# Patient Record
Sex: Male | Born: 1980 | Race: White | Hispanic: No | Marital: Married | Smoking: Never smoker
Health system: Southern US, Community
[De-identification: ages and names within clinical notes are randomized; demographics above are authoritative.]

## PROBLEM LIST (undated history)

## (undated) DIAGNOSIS — Z8619 Personal history of other infectious and parasitic diseases: Secondary | ICD-10-CM

## (undated) DIAGNOSIS — K760 Fatty (change of) liver, not elsewhere classified: Secondary | ICD-10-CM

## (undated) DIAGNOSIS — I10 Essential (primary) hypertension: Secondary | ICD-10-CM

## (undated) DIAGNOSIS — B019 Varicella without complication: Secondary | ICD-10-CM

## (undated) DIAGNOSIS — R12 Heartburn: Secondary | ICD-10-CM

## (undated) DIAGNOSIS — E1165 Type 2 diabetes mellitus with hyperglycemia: Secondary | ICD-10-CM

## (undated) HISTORY — DX: Fatty (change of) liver, not elsewhere classified: K76.0

## (undated) HISTORY — DX: Personal history of other infectious and parasitic diseases: Z86.19

## (undated) HISTORY — DX: Heartburn: R12

## (undated) HISTORY — PX: KNEE SURGERY: SHX244

## (undated) HISTORY — DX: Varicella without complication: B01.9

## (undated) HISTORY — DX: Type 2 diabetes mellitus with hyperglycemia: E11.65

---

## 2007-10-14 ENCOUNTER — Emergency Department (HOSPITAL_COMMUNITY): Admission: EM | Admit: 2007-10-14 | Discharge: 2007-10-14 | Payer: Self-pay | Admitting: Emergency Medicine
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
## 2010-11-26 ENCOUNTER — Ambulatory Visit (INDEPENDENT_AMBULATORY_CARE_PROVIDER_SITE_OTHER): Payer: Managed Care, Other (non HMO) | Admitting: Family

## 2010-11-26 ENCOUNTER — Encounter: Payer: Self-pay | Admitting: Family

## 2010-11-26 VITALS — BP 146/86 | HR 72 | Temp 98.1°F | Resp 16 | Ht 70.0 in | Wt 198.1 lb

## 2010-11-26 DIAGNOSIS — Z0000 Encounter for general adult medical examination without abnormal findings: Secondary | ICD-10-CM | POA: Insufficient documentation

## 2010-11-26 DIAGNOSIS — I10 Essential (primary) hypertension: Secondary | ICD-10-CM | POA: Insufficient documentation

## 2010-11-26 DIAGNOSIS — K219 Gastro-esophageal reflux disease without esophagitis: Secondary | ICD-10-CM

## 2010-11-26 DIAGNOSIS — R030 Elevated blood-pressure reading, without diagnosis of hypertension: Secondary | ICD-10-CM

## 2010-11-26 MED ORDER — OMEPRAZOLE 40 MG PO CPDR: 40.0000 mg | DELAYED_RELEASE_CAPSULE | Freq: Every day | ORAL | Status: DC

## 2010-11-26 NOTE — Progress Notes (Signed)
Subjective:    Patient ID: Mario Ibarra, male    DOB: 02/03/1981, 30 y.o.   MRN: [id number]  HPI  Breakfast- double cheesburger/tea, no fries.  Breakfast- peanuts/coke, Dinner- last night, subway terriyaki chicken.  Last tetanus shot- 1 year ago.    Notes that "everything I eat gives it to me."  Feels like it "burns going down."  GF has had a lot of issues with reflux.     Review of Systems  Constitutional: Negative for fever and unexpected weight change.  HENT: Negative for hearing loss, ear pain and neck pain.   Respiratory: Negative for apnea.   Cardiovascular: Positive for chest pain.  Gastrointestinal: Negative for nausea, diarrhea, constipation, blood in stool and abdominal distention.  Genitourinary: Negative for dysuria and frequency.  Musculoskeletal: Negative for back pain, joint swelling and arthralgias.  Skin: Negative for rash.  Neurological: Negative for dizziness and numbness.  Hematological: Negative for adenopathy.  Psychiatric/Behavioral:       Denies hx of depression or anxiety.       Objective:   Physical Exam  Constitutional: He is oriented to person, place, and time. He appears well-developed and well-nourished. No distress.  HENT:  Head: Atraumatic.  Eyes: Conjunctivae are normal. Pupils are equal, round, and reactive to light.  Neck: Normal range of motion. Neck supple. No thyromegaly present.  Cardiovascular: Normal rate and regular rhythm.   Pulmonary/Chest: Effort normal and breath sounds normal. No respiratory distress. He has no wheezes. He has no rales.  Abdominal: Soft. Bowel sounds are normal. He exhibits no distension. There is no tenderness. There is no guarding.  Musculoskeletal: Normal range of motion.  Neurological: He is alert and oriented to person, place, and time. He has normal reflexes.  Skin: Skin is warm and dry.  Psychiatric: He has a normal mood and affect. His behavior is normal.          Assessment & Plan:

## 2010-11-26 NOTE — Assessment & Plan Note (Signed)
Patient was counseled on low sodium diet.  Will repeat at his f/u visit in 1 month.

## 2010-11-26 NOTE — Assessment & Plan Note (Signed)
30 year old male presents today for CPX.  Patient was counseled on diet, exercise weight loss.  Advised to cut down on sugared beverages.  Tetanus up to date.  Pt will return fasting next week for labs.

## 2010-11-26 NOTE — Assessment & Plan Note (Signed)
Plan to have patient start PPI.   F/u in 1 month.

## 2010-11-26 NOTE — Patient Instructions (Signed)
2 Gram Low Sodium Diet  A 2 gram sodium diet restricts the amount of sodium in the diet to no more than 2 grams (g) or 2000 milligrams (mg) daily. Limiting the amount of sodium is often used to help lower blood pressure. It is important if you have heart, liver, or kidney problems. Many foods contain sodium for flavor and sometimes as a preservative. When the amount of sodium in a diet needs to be low, it is important to know what to look for when choosing foods and drinks. The following includes some information and guidelines to help make it easier for you to adapt to a low sodium diet.  QUICK TIPS   Do not add salt to food.    Avoid convenience items and fast food.    Choose unsalted snack foods.    Buy lower sodium products, often labeled as "lower sodium" or "no salt added."    Check food labels to learn how much sodium is in 1 serving.    When eating at a restaurant, ask that your food be prepared with less salt or none, if possible.   READING FOOD LABELS FOR SODIUM INFORMATION  The nutrition facts label is a good place to find how much sodium is in foods. Look for products with no more than 500 to 600 mg of sodium per meal and no more than 150 mg per serving.  Remember that 2 g = 2000 mg.  The food label may also list foods as:   Sodium-free: Less than 5 mg in a serving.    Very low sodium: 35 mg or less in a serving.    Low-sodium: 140 mg or less in a serving.    Light in sodium: 50% less sodium in a serving. For example, if a food that usually has 300 mg of sodium is changed to become light in sodium, it will have 150 mg of sodium.    Reduced sodium: 25% less sodium in a serving. For example, if a food that usually has 400 mg of sodium is changed to reduced sodium, it will have 300 mg of sodium.   CHOOSING FOODS  AVOID  CHOOSE    Grains:  Salted crackers and snack items.  Some cereals, including instant hot cereals.  Bread stuffing and biscuit mixes.  Seasoned rice or pasta mixes.  Grains:   Unsalted snack items.  Low-sodium cereals, oats, puffed wheat and rice, shredded wheat.  English muffins and bread.  Pasta.    Meats:  Salted, canned, smoked, spiced, or pickled meats, including fish and poultry.  Bacon, ham, sausage, cold cuts, hot dogs, anchovies.  Meats:  Low-sodium canned tuna and salmon.  Fresh or frozen meat, poultry, and fish.    Dairy:  Processed cheese and spreads.  Cottage cheese.  Buttermilk and condensed milk.  Regular cheese.  Dairy:  Milk.  Low-sodium cottage cheese.  Yogurt.  Sour cream.  Low-sodium cheese.    Fruits and Vegetables:  Regular canned vegetables.  Regular canned tomato sauce and paste.  Frozen vegetables in sauces.  Olives.  Pickles.  Relishes.  Sauerkraut.  Fruits and Vegetables:  Low-sodium canned vegetables.  Low-sodium tomato sauce and paste.  Fresh and frozen vegetables.  Fresh and frozen fruit.    Condiments:  Canned and packaged gravies.  Worcestershire sauce.  Tartar sauce.  Barbecue sauce.  Soy sauce.  Steak sauce.  Ketchup.  Onion, garlic, and table salt.  Meat flavorings and tenderizers.  Condiments:  Fresh and dried herbs   and spices.  Low-sodium varieties of mustard and ketchup.  Lemon juice.  Tabasco sauce.  Horseradish.    SAMPLE 2 GRAM SODIUM MEAL PLAN  Meal  Foods  Sodium (mg)    Breakfast  1 cup low-fat milk  2 slices whole-wheat toast  1 tablespoon heart-healthy margarine  1 hard-boiled egg  1 small orange  143  270  153  139  0    Lunch  1 cup raw carrots   cup hummus  1 cup low-fat milk   cup red grapes  1 whole-wheat pita bread  76  298  143  2  356    Dinner  1 cup whole-wheat pasta  1 cup low-sodium tomato sauce  3 ounces lean ground beef  1 small side salad (1 cup raw spinach leaves,  cup cucumber,  cup yellow bell pepper) with 1 teaspoon olive oil and 1 teaspoon red wine vinegar  2  73  57  25    Snack  1 container low-fat vanilla yogurt  3 graham cracker squares  107  127    Nutrient Analysis  Calories: 2033  Protein: 77 g   Carbohydrate: 282 g  Fat: 72 g  Sodium: 1971 mg  Document Released: 08/15/2005 Document Re-Released: 02/02/2010  [employer] Patient Information 2011 [employer].

## 2010-11-30 LAB — CBC WITH DIFFERENTIAL/PLATELET
Eosinophils Absolute: 0.2 10*3/uL (ref 0.0–0.7)
Eosinophils Relative: 2 % (ref 0–5)
Hemoglobin: 14.9 g/dL (ref 13.0–17.0)
Lymphs Abs: 2.4 10*3/uL (ref 0.7–4.0)
MCH: 28.9 pg (ref 26.0–34.0)
MCV: 87.8 fL (ref 78.0–100.0)
Monocytes Relative: 6 % (ref 3–12)
Platelets: 259 10*3/uL (ref 150–400)
RBC: 5.15 MIL/uL (ref 4.22–5.81)

## 2010-11-30 LAB — BASIC METABOLIC PANEL
CO2: 26 mEq/L (ref 19–32)
Calcium: 9.5 mg/dL (ref 8.4–10.5)
Creat: 1.07 mg/dL (ref 0.40–1.50)
Glucose, Bld: 100 mg/dL — ABNORMAL HIGH (ref 70–99)

## 2010-11-30 LAB — HEPATIC FUNCTION PANEL
Albumin: 4.6 g/dL (ref 3.5–5.2)
Total Protein: 7.2 g/dL (ref 6.0–8.3)

## 2010-11-30 LAB — TSH: TSH: 3.773 u[IU]/mL (ref 0.350–4.500)

## 2010-12-05 ENCOUNTER — Encounter: Payer: Self-pay | Admitting: Family

## 2010-12-24 ENCOUNTER — Encounter: Payer: Self-pay | Admitting: Family

## 2010-12-24 ENCOUNTER — Ambulatory Visit (INDEPENDENT_AMBULATORY_CARE_PROVIDER_SITE_OTHER): Payer: Managed Care, Other (non HMO) | Admitting: Family

## 2010-12-24 ENCOUNTER — Telehealth: Payer: Self-pay | Admitting: *Deleted

## 2010-12-24 DIAGNOSIS — R030 Elevated blood-pressure reading, without diagnosis of hypertension: Secondary | ICD-10-CM

## 2010-12-24 DIAGNOSIS — R7989 Other specified abnormal findings of blood chemistry: Secondary | ICD-10-CM | POA: Insufficient documentation

## 2010-12-24 DIAGNOSIS — K219 Gastro-esophageal reflux disease without esophagitis: Secondary | ICD-10-CM

## 2010-12-24 DIAGNOSIS — R7309 Other abnormal glucose: Secondary | ICD-10-CM

## 2010-12-24 DIAGNOSIS — Z0000 Encounter for general adult medical examination without abnormal findings: Secondary | ICD-10-CM

## 2010-12-24 NOTE — Telephone Encounter (Signed)
Message copied by Mervin Kung on Fri Dec 24, 2010  4:54 PM ------      Message from: O'SULLIVAN, MELISSA      Created: Fri Dec 24, 2010  3:12 PM       Pls send A1C (hyperglycemia) LFT (elevated LFTs) Lipid (V70 to lab)

## 2010-12-24 NOTE — Assessment & Plan Note (Signed)
Pt will return fasting to lab next week to have cholesterol and LFT's drawn.

## 2010-12-24 NOTE — Assessment & Plan Note (Signed)
Clinically improved.  Continue PPI.

## 2010-12-24 NOTE — Telephone Encounter (Signed)
Lab order entered and forwarded to the lab.

## 2010-12-24 NOTE — Assessment & Plan Note (Addendum)
BP Readings from Last 3 Encounters:  12/24/10 130/90  11/26/10 146/86   BP is better today.  Will continue to monitor.  I commended him on his dietary changes.

## 2010-12-24 NOTE — Patient Instructions (Signed)
Please return fasting to the lab in the next 1 week to complete fasting lab work.

## 2010-12-24 NOTE — Progress Notes (Signed)
Subjective:    Patient ID: Mario Ibarra, male    DOB: 1981/03/23, 30 y.o.   MRN: [id number]  HPI  Mario Ibarra is a 30 year old male who presents today for follow up.   GERD- reports that he has been taking omeprazole and has not had any further gerd symptoms. Finds that if he misses a dose or two he starts to feel symptoms.   Elevated blood pressure- he reports that he has made some positive dietary changes- which includes cutting back from 10 sodas a day to 3, and trying to ease off of the sweet tea.     Elevated LFT's- he was noted to have a mild elevation of LFT's last visit.      Review of Systems See HPI  Past Medical History  Diagnosis Date  . History of chicken pox   . Heartburn   . Chicken pox     History   Social History  . Marital Status: Married    Spouse Name: N/A    Number of Children: 1  . Years of Education: N/A   Occupational History  . 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 Smoking status: Never Smoker   . Smokeless tobacco: Never Used  . Alcohol Use: 0.6 oz/week    1 Cans of beer per week  . Drug Use: No  . Sexually Active: Not on file   Other Topics Concern  . Not on file   Social History Narrative   Regular exercise: noCaffeine: "all the time"     Past Surgical History  Procedure Date  . Knee surgery age 57    bone spur? left knee    Family History  Problem Relation Age of Onset  . Diabetes Maternal Grandmother   . Heart disease Maternal Grandmother     CABG x 4  . Diabetes Paternal Grandfather   . Kidney disease Paternal Grandfather     kidney failure due to diabetes?  . Sleep apnea Brother     No Known Allergies  Current Outpatient Prescriptions on File Prior to Visit  Medication Sig Dispense Refill  . omeprazole (PRILOSEC) 40 MG capsule Take 1 capsule (40 mg total) by mouth daily.  30 capsule  3    BP 130/90  Pulse 72  Temp(Src) 98.2 F (36.8 C) (Oral)  Resp 16  Wt  199 lb (90.266 kg)       Objective:   Physical Exam  Constitutional: He appears well-developed and well-nourished.  HENT:  Head: Atraumatic.  Cardiovascular: Normal rate and regular rhythm.   Pulmonary/Chest: Effort normal and breath sounds normal.  Abdominal: Soft. Bowel sounds are normal.          Assessment & Plan:

## 2011-01-06 LAB — HEMOGLOBIN A1C
Hgb A1c MFr Bld: 5.7 % — ABNORMAL HIGH (ref <5.7)
Mean Plasma Glucose: 117 mg/dL — ABNORMAL HIGH (ref <117)

## 2011-01-06 LAB — HEPATIC FUNCTION PANEL
Albumin: 4.5 g/dL (ref 3.5–5.2)
Total Protein: 7.4 g/dL (ref 6.0–8.3)

## 2011-01-07 LAB — LIPID PANEL
HDL: 35 mg/dL — ABNORMAL LOW (ref >39)
Triglycerides: 217 mg/dL — ABNORMAL HIGH (ref <150)

## 2011-01-12 ENCOUNTER — Telehealth: Payer: Self-pay | Admitting: Family

## 2011-01-12 NOTE — Telephone Encounter (Signed)
Liver tests are now normal.  Diabetes testing normal.  Total chol 203 (goal <200), triglycerides slightly elevated.  He should keep up the good work with diet, exercise, weight loss and avoid concentrated sweets.  Follow up in 6 months for BP check and repeat FLP.

## 2011-01-12 NOTE — Telephone Encounter (Signed)
Please advise 

## 2011-01-12 NOTE — Telephone Encounter (Signed)
Pt would like lab results.  

## 2011-01-12 NOTE — Telephone Encounter (Signed)
Pt notified and declines to schedule appt at this time. States that he will be traveling in a few months and doesn't know the exact dates yet. He will call us back to arrange f/u.

## 2011-06-28 ENCOUNTER — Other Ambulatory Visit: Payer: Self-pay | Admitting: Family

## 2011-06-28 NOTE — Telephone Encounter (Signed)
Pt left voice message requesting [facility]. States he has just moved to [place]. Advised pt I would send 30 day supply to pharmacy but he is due for a follow up in November. Pt states he will have to call us back to arrange appt as he is trying to close the paperwork on his new house this month.

## 2011-07-05 MED ORDER — OMEPRAZOLE 40 MG PO CPDR: 40.0000 mg | DELAYED_RELEASE_CAPSULE | Freq: Every day | ORAL | Status: DC

## 2011-07-05 NOTE — Telephone Encounter (Signed)
Rx cancelled at [facility]  

## 2011-07-05 NOTE — Telephone Encounter (Signed)
Addended by: Mervin Kung A on: 07/05/2011 09:06 AM   Modules accepted: Orders

## 2011-07-05 NOTE — Telephone Encounter (Signed)
[facility] call [facility] that [facility]. Advised pt refill was sent to [facility] in error but would be corrected now. [facility] [place].

## 2011-08-29 ENCOUNTER — Ambulatory Visit (INDEPENDENT_AMBULATORY_CARE_PROVIDER_SITE_OTHER): Payer: Managed Care, Other (non HMO) | Admitting: Family

## 2011-08-29 ENCOUNTER — Encounter: Payer: Self-pay | Admitting: Family

## 2011-08-29 VITALS — BP 146/80 | HR 80 | Temp 98.0°F | Resp 18 | Ht 70.0 in | Wt 205.0 lb

## 2011-08-29 DIAGNOSIS — K219 Gastro-esophageal reflux disease without esophagitis: Secondary | ICD-10-CM

## 2011-08-29 DIAGNOSIS — I10 Essential (primary) hypertension: Secondary | ICD-10-CM

## 2011-08-29 DIAGNOSIS — R0789 Other chest pain: Secondary | ICD-10-CM

## 2011-08-29 MED ORDER — AMLODIPINE BESYLATE 5 MG PO TABS: 5.0000 mg | ORAL_TABLET | Freq: Every day | ORAL | Status: DC

## 2011-08-29 MED ORDER — OMEPRAZOLE 40 MG PO CPDR: 40.0000 mg | DELAYED_RELEASE_CAPSULE | Freq: Every day | ORAL | Status: DC

## 2011-08-29 NOTE — Patient Instructions (Signed)
You will be contacted about your referral for stress test.  Follow up in 1 month.  Go to the ER if you develop recurrent chest pain.

## 2011-08-29 NOTE — Progress Notes (Signed)
Subjective:    Patient ID: Mario Ibarra, male    DOB: 1981-02-10, 30 y.o.   MRN: [id number]  HPI   Mr.  Mario Ibarra is a 30 yr old male who presents today for follow up of his elevated blood pressure.  He has been noted to have elevated blood pressure on several occasions and has been working hard on lifestyle, diet and exercise.  He denies any shortness of breath or edema, but does report an episode of left sided chest pain on Christmas day which occurred at rest.  He noted some associated left arm discomfort. Symptoms resolved on their own and he denies recurrence since that time. Also denies CP with exercise. He does tell me that he had an uncle who had an MI in his 98's.  GERD- reports that this is well controlled with the use of prilosec. States that if he forgets it for a few days his symptoms return.   Review of Systems see HPI    Past Medical History  Diagnosis Date  . History of chicken pox   . Heartburn   . Chicken pox     History   Social History  . Marital Status: Married    Spouse Name: N/A    Number of Children: 1  . Years of Education: N/A   Occupational History  . 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 Smoking status: Never Smoker   . Smokeless tobacco: Never Used  . Alcohol Use: 0.6 oz/week    1 Cans of beer per week  . Drug Use: No  . Sexually Active: Not on file   Other Topics Concern  . Not on file   Social History Narrative   Regular exercise: noCaffeine: "all the time"     Past Surgical History  Procedure Date  . Knee surgery age 34    bone spur? left knee    Family History  Problem Relation Age of Onset  . Diabetes Maternal Grandmother   . Heart disease Maternal Grandmother     CABG x 4  . Diabetes Paternal Grandfather   . Kidney disease Paternal Grandfather     kidney failure due to diabetes?  . Sleep apnea Brother     No Known Allergies  No current outpatient prescriptions on file  prior to visit.    BP 146/80  Pulse 80  Temp(Src) 98 F (36.7 C) (Oral)  Resp 18  Ht 5\' 10"  (1.778 m)  Wt 205 lb (92.987 kg)  BMI 29.41 kg/m2  SpO2 99%    Objective:   Physical Exam  Constitutional: He appears well-developed and well-nourished. No distress.  HENT:  Head: Normocephalic and atraumatic.  Cardiovascular: Normal rate and regular rhythm.   No murmur heard. Pulmonary/Chest: Effort normal and breath sounds normal. No respiratory distress. He has no wheezes. He has no rales. He exhibits no tenderness.  Musculoskeletal: He exhibits no edema.  Skin: Skin is warm and dry.  Psychiatric: He has a normal mood and affect. His behavior is normal. Judgment and thought content normal.          Assessment & Plan:

## 2011-08-30 DIAGNOSIS — R0789 Other chest pain: Secondary | ICD-10-CM | POA: Insufficient documentation

## 2011-08-30 NOTE — Assessment & Plan Note (Signed)
Pt with single episode of atypical chest pain.  I have personally reviewed the EKG performed in the office today. It shows normal sinus rhythm without any obvious signs of ischemia.  However, given family hx I have advised him to complete a stress test and he is agreeable to do so.  In the meantime, I will have instructed him to go to the ED if he develops recurrent chest pain.

## 2011-08-30 NOTE — Assessment & Plan Note (Signed)
Stable on Prilosec. Continue same.

## 2011-08-30 NOTE — Assessment & Plan Note (Signed)
BP Readings from Last 3 Encounters:  08/29/11 146/80  12/24/10 130/90  11/26/10 146/86   At this point I think that his blood pressure is consistently running higher than it should.  I have recommended addition of amlodipine once daily and continued efforts at weight loss, and low sodium diet.

## 2011-09-14 ENCOUNTER — Encounter: Payer: Managed Care, Other (non HMO) | Admitting: Physician Assistant

## 2011-09-16 ENCOUNTER — Telehealth: Payer: Self-pay | Admitting: Family

## 2011-09-16 NOTE — Telephone Encounter (Signed)
Mario Ibarra cancelled his stress test  Per [facility] Kidney in cardiology (scheduled for Jan 16  )

## 2011-10-30 ENCOUNTER — Other Ambulatory Visit: Payer: Self-pay | Admitting: Family

## 2011-10-31 NOTE — Telephone Encounter (Signed)
Rx refill sent to pharmacy. 

## 2011-12-24 ENCOUNTER — Other Ambulatory Visit: Payer: Self-pay | Admitting: Family

## 2011-12-24 NOTE — Telephone Encounter (Signed)
Amlodipine refill sent to pt's pharmacy #[id number] x no refills. Pt is past due for follow up. Last seen in December and advised f/u in 1 month. Please call pt and arrange f/u before current supply runs out.

## 2011-12-26 NOTE — Telephone Encounter (Signed)
Left detailed message informing patient that a 30 day supply of amlodipine has been sent to the pharmacy and that patient does need to be seen before further refills can be given.

## 2012-02-08 ENCOUNTER — Other Ambulatory Visit: Payer: Self-pay | Admitting: Family

## 2012-02-08 NOTE — Telephone Encounter (Signed)
Pt past due for follow up. I have sent a 2 week supply of amlodipine to his pharmacy but we will not be able to give further refills without being seen. He should schedule appt before his 2 week supply runs out. Please call pt to arrange.

## 2012-02-09 NOTE — Telephone Encounter (Signed)
Informed patient that a 2 week supply of amlodopine has been sent to pharmacy and patient did make a follow up appointment for 02/15/12

## 2012-02-15 ENCOUNTER — Encounter: Payer: Self-pay | Admitting: Family

## 2012-02-15 ENCOUNTER — Ambulatory Visit (INDEPENDENT_AMBULATORY_CARE_PROVIDER_SITE_OTHER): Payer: Managed Care, Other (non HMO) | Admitting: Family

## 2012-02-15 VITALS — BP 140/80 | HR 70 | Temp 98.2°F | Resp 16 | Ht 70.0 in | Wt 194.0 lb

## 2012-02-15 DIAGNOSIS — K219 Gastro-esophageal reflux disease without esophagitis: Secondary | ICD-10-CM

## 2012-02-15 DIAGNOSIS — I10 Essential (primary) hypertension: Secondary | ICD-10-CM

## 2012-02-15 MED ORDER — AMLODIPINE BESYLATE 5 MG PO TABS: 5.0000 mg | ORAL_TABLET | Freq: Every day | ORAL | Status: DC

## 2012-02-15 NOTE — Assessment & Plan Note (Signed)
Stable on PPI, continue same.  

## 2012-02-15 NOTE — Progress Notes (Signed)
Subjective:    Patient ID: Mario Ibarra, male    DOB: 05/21/1981, 31 y.o.   MRN: [id number]  HPI  Mario Ibarra is a 31 yr old male who presents today for follow up of his HTN.  HTN- He reports that he continues his amlodipine.  He denies chest pain.  He cancelled his stress test and tells me that since he started the PPI he has not had any further chest pain. He has lost 9 pounds cutting back on soda.    Denis chest pain- GERD symptoms improved.  He is currently only taking omeprazole every other day.  Review of Systems    see HPI Objective:   Physical Exam  Constitutional: He appears well-developed and well-nourished. No distress.  Cardiovascular: Normal rate and regular rhythm.   No murmur heard. Pulmonary/Chest: Effort normal and breath sounds normal. No respiratory distress. He has no wheezes. He has no rales. He exhibits no tenderness.  Psychiatric: He has a normal mood and affect. His behavior is normal. Judgment and thought content normal.          Assessment & Plan:

## 2012-02-15 NOTE — Patient Instructions (Addendum)
Please schedule a follow up appointment in 3 months.

## 2012-02-15 NOTE — Assessment & Plan Note (Signed)
BP Readings from Last 3 Encounters:  02/15/12 140/80  08/29/11 146/80  12/24/10 130/90   BP better today, continue amlodipine and low sodium diet.

## 2012-03-06 ENCOUNTER — Other Ambulatory Visit: Payer: Self-pay | Admitting: Family

## 2012-03-07 ENCOUNTER — Other Ambulatory Visit: Payer: Self-pay | Admitting: *Deleted

## 2012-03-07 NOTE — Telephone Encounter (Signed)
Received message from pt stating he is out of refills on his medications and the pharmacy hasn't received anything from [place]. [facility] [place]. Attempted to reach pt and left detailed message that [facility] and omeprazole was refilled yesteray. Advised pt to contact pharmacy again to check refills on file from June and to call if any questions.

## 2012-05-14 ENCOUNTER — Ambulatory Visit (INDEPENDENT_AMBULATORY_CARE_PROVIDER_SITE_OTHER): Payer: Managed Care, Other (non HMO) | Admitting: Family

## 2012-05-14 ENCOUNTER — Encounter: Payer: Self-pay | Admitting: Family

## 2012-05-14 VITALS — BP 150/80 | HR 82 | Temp 98.2°F | Resp 16 | Ht 70.0 in | Wt 196.1 lb

## 2012-05-14 DIAGNOSIS — I10 Essential (primary) hypertension: Secondary | ICD-10-CM

## 2012-05-14 DIAGNOSIS — K219 Gastro-esophageal reflux disease without esophagitis: Secondary | ICD-10-CM

## 2012-05-14 MED ORDER — AMLODIPINE BESYLATE 10 MG PO TABS: 10.0000 mg | ORAL_TABLET | Freq: Every day | ORAL | Status: DC

## 2012-05-14 NOTE — Assessment & Plan Note (Signed)
Deteriorated.  Will increase amlodipine from 5mg  to 10mg .

## 2012-05-14 NOTE — Progress Notes (Signed)
Subjective:    Patient ID: Mario Ibarra, male    DOB: 1980-09-12, 31 y.o.   MRN: [id number]  HPI  Mr.  Mario Ibarra is a 31 yr old male who presents today for follow up of his HTN. He reports compliance with amlodipine.  Denies CP, SOB, swelling.    BP Readings from Last 3 Encounters:  05/14/12 150/80  02/15/12 140/80  08/29/11 146/80    GERD- reports that this remains well controlled on omeprazole.   Review of Systems See HPI  Past Medical History  Diagnosis Date  . History of chicken pox   . Heartburn   . Chicken pox     History   Social History  . Marital Status: Married    Spouse Name: N/A    Number of Children: 1  . Years of Education: N/A   Occupational History  . 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 Smoking status: Never Smoker   . Smokeless tobacco: Never Used  . Alcohol Use: 0.6 oz/week    1 Cans of beer per week  . Drug Use: No  . Sexually Active: Not on file   Other Topics Concern  . Not on file   Social History Narrative   Regular exercise: noCaffeine: "all the time"     Past Surgical History  Procedure Date  . Knee surgery age 61    bone spur? left knee    Family History  Problem Relation Age of Onset  . Diabetes Maternal Grandmother   . Heart disease Maternal Grandmother     CABG x 4  . Diabetes Paternal Grandfather   . Kidney disease Paternal Grandfather     kidney failure due to diabetes?  . Sleep apnea Brother     No Known Allergies  Current Outpatient Prescriptions on File Prior to Visit  Medication Sig Dispense Refill  . omeprazole (PRILOSEC) 40 MG capsule TAKE 1 CAPSULE (40 MG TOTAL) BY MOUTH DAILY.  30 capsule  3  . DISCONTD: amLODipine (NORVASC) 5 MG tablet Take 1 tablet (5 mg total) by mouth daily.  30 tablet  2    BP 150/80  Pulse 82  Temp 98.2 F (36.8 C) (Oral)  Resp 16  Ht 5\' 10"  (1.778 m)  Wt 196 lb 1.3 oz (88.941 kg)  BMI 28.13 kg/m2  SpO2 99%         Objective:   Physical Exam  Constitutional: He appears well-developed and well-nourished. No distress.  Cardiovascular: Normal rate and regular rhythm.   No murmur heard. Pulmonary/Chest: Effort normal and breath sounds normal. No respiratory distress. He has no wheezes. He has no rales. He exhibits no tenderness.  Psychiatric: He has a normal mood and affect. His behavior is normal. Judgment and thought content normal.          Assessment & Plan:

## 2012-05-14 NOTE — Patient Instructions (Addendum)
Please schedule a follow up appointment in 1 month.

## 2012-05-14 NOTE — Assessment & Plan Note (Signed)
Stable on PPI.  Continue same.  Pt is expecting new baby in 6 weeks.  Advised pt to receive flu shot to protect infant. He declines.

## 2012-07-18 ENCOUNTER — Ambulatory Visit (INDEPENDENT_AMBULATORY_CARE_PROVIDER_SITE_OTHER): Payer: Managed Care, Other (non HMO) | Admitting: Family

## 2012-07-18 ENCOUNTER — Encounter: Payer: Self-pay | Admitting: Family

## 2012-07-18 VITALS — BP 120/80 | HR 60 | Temp 98.0°F | Resp 16 | Wt 189.0 lb

## 2012-07-18 DIAGNOSIS — I10 Essential (primary) hypertension: Secondary | ICD-10-CM

## 2012-07-18 MED ORDER — AMLODIPINE BESYLATE 5 MG PO TABS: 5.0000 mg | ORAL_TABLET | Freq: Every day | ORAL | Status: DC

## 2012-07-18 NOTE — Patient Instructions (Addendum)
Please schedule a follow up appointment in 3 months.

## 2012-07-18 NOTE — Assessment & Plan Note (Signed)
BP Readings from Last 3 Encounters:  07/18/12 120/80  05/14/12 150/80  02/15/12 140/80   BP looks great today on only 5mg  of amlodipine. Continue same.  No significant swelling on this dose.  Skin discoloration appears to be mild freckling.  Monitor.

## 2012-07-18 NOTE — Progress Notes (Signed)
Subjective:    Patient ID: Mario Ibarra, male    DOB: [birth year], 31 y.o.   MRN: [id number]  HPI  HTN-  Pt reports that he increased his amlodipine from 5 to 10 mg for only 2 days. He noted some increased swelling and then dropped the dose back to 5 mg.  He has lost some weight since his last visit. He also notes some skin discoloration on his right shin and wonders if it is related to the amlodipine.   Review of Systems See hpi      Objective:   Physical Exam  Constitutional: He appears well-developed and well-nourished.  Cardiovascular: Normal rate and regular rhythm.   No murmur heard. Pulmonary/Chest: Effort normal and breath sounds normal. No respiratory distress. He has no wheezes. He has no rales. He exhibits no tenderness.  Musculoskeletal: He exhibits no edema.  Skin: Skin is warm and dry.       Some freckling of skin right lower shin. No rash.  Psychiatric: He has a normal mood and affect. His behavior is normal. Judgment and thought content normal.          Assessment & Plan:

## 2012-11-05 ENCOUNTER — Other Ambulatory Visit: Payer: Self-pay | Admitting: Family

## 2012-11-05 NOTE — Telephone Encounter (Signed)
30 day supply of amlodipine sent to pharmacy. Pt was due for follow up in February. Please call pt to arrange appt before further refills are due.

## 2012-11-06 NOTE — Telephone Encounter (Signed)
Informed patient of medication refill and he states that he will have to call back for appointment when he knows his schedule. I did tell him that no further refills can be given w/o an appointment

## 2012-12-07 ENCOUNTER — Telehealth: Payer: Self-pay | Admitting: Family

## 2012-12-07 MED ORDER — AMLODIPINE BESYLATE 5 MG PO TABS: ORAL_TABLET | ORAL | Status: DC

## 2012-12-07 NOTE — Telephone Encounter (Signed)
Patient scheduled an appointment for 12/17/12 but states that he is out of amlodipine and would like to know if he could have enough refill to last him until his appointment? [facility]

## 2012-12-07 NOTE — Telephone Encounter (Signed)
30 day supply sent to pharmacy.  

## 2012-12-17 ENCOUNTER — Encounter: Payer: Self-pay | Admitting: Family

## 2012-12-17 ENCOUNTER — Ambulatory Visit (INDEPENDENT_AMBULATORY_CARE_PROVIDER_SITE_OTHER): Payer: Managed Care, Other (non HMO) | Admitting: Family

## 2012-12-17 VITALS — BP 144/80 | HR 69 | Temp 97.6°F | Resp 16 | Ht 70.0 in | Wt 189.1 lb

## 2012-12-17 DIAGNOSIS — I10 Essential (primary) hypertension: Secondary | ICD-10-CM

## 2012-12-17 DIAGNOSIS — K219 Gastro-esophageal reflux disease without esophagitis: Secondary | ICD-10-CM

## 2012-12-17 MED ORDER — OMEPRAZOLE 40 MG PO CPDR: 40.0000 mg | DELAYED_RELEASE_CAPSULE | Freq: Every day | ORAL | Status: DC

## 2012-12-17 MED ORDER — HYDROCHLOROTHIAZIDE 25 MG PO TABS: 25.0000 mg | ORAL_TABLET | Freq: Every day | ORAL | Status: DC

## 2012-12-17 MED ORDER — AMLODIPINE BESYLATE 5 MG PO TABS: ORAL_TABLET | ORAL | Status: DC

## 2012-12-17 NOTE — Progress Notes (Signed)
Subjective:    Patient ID: Mario Ibarra, male    DOB: 1980/10/17, 32 y.o.   MRN: [id number]  HPI  Mario Ibarra is a 32 yr old male who presents today for follow up of his HTN.  He is currently maintained on amlodipine 5mg  PO daily.  He was intolerant of the 10mg  dose in the past due to edema.  He denies current swelling. Denies CP or SOB.  GERD-  He uses the prilosec prn.    Review of Systems See HPI  Past Medical History  Diagnosis Date  . History of chicken pox   . Heartburn   . Chicken pox     History   Social History  . Marital Status: Married    Spouse Name: N/A    Number of Children: 1  . Years of Education: N/A   Occupational History  . 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 Smoking status: Never Smoker   . Smokeless tobacco: Never Used  . Alcohol Use: 0.6 oz/week    1 Cans of beer per week  . Drug Use: No  . Sexually Active: Not on file   Other Topics Concern  . Not on file   Social History Narrative   Regular exercise: no   Caffeine: "all the time"     Past Surgical History  Procedure Laterality Date  . Knee surgery  age 31    bone spur? left knee    Family History  Problem Relation Age of Onset  . Diabetes Maternal Grandmother   . Heart disease Maternal Grandmother     CABG x 4  . Diabetes Paternal Grandfather   . Kidney disease Paternal Grandfather     kidney failure due to diabetes?  . Sleep apnea Brother     No Known Allergies  No current outpatient prescriptions on file prior to visit.   No current facility-administered medications on file prior to visit.    BP 144/80  Pulse 69  Temp(Src) 97.6 F (36.4 C) (Oral)  Resp 16  Ht 5\' 10"  (1.778 m)  Wt 189 lb 1.3 oz (85.766 kg)  BMI 27.13 kg/m2  SpO2 99%       Objective:   Physical Exam  Constitutional: He is oriented to person, place, and time. He appears well-developed and well-nourished. No distress.  Cardiovascular: Normal  rate and regular rhythm.   No murmur heard. Pulmonary/Chest: Effort normal and breath sounds normal. No respiratory distress. He has no wheezes. He has no rales. He exhibits no tenderness.  Neurological: He is alert and oriented to person, place, and time.  Psychiatric: He has a normal mood and affect. His behavior is normal. Judgment and thought content normal.          Assessment & Plan:

## 2012-12-17 NOTE — Patient Instructions (Addendum)
Continue amlodipine and start hydrochlorothiaze. Follow up in 2 weeks.

## 2012-12-17 NOTE — Assessment & Plan Note (Signed)
Uncontrolled. Continue amlodipine, add hctz, follow up in 2 weeks for BP check and BMET.

## 2012-12-17 NOTE — Assessment & Plan Note (Signed)
Stable on prn prilosec 40mg .  Refills provided.  I did suggest that he consider trying the otc dose 20mg  and seeing if this is strong enough for him.

## 2013-01-28 ENCOUNTER — Other Ambulatory Visit: Payer: Self-pay | Admitting: Family

## 2013-01-28 NOTE — Telephone Encounter (Signed)
2 week supply of HCTZ sent to pharmacy. Pt last seen in April and advised 2 week follow up for BP check and labs. Please call pt to arrange appt.

## 2013-01-31 NOTE — Telephone Encounter (Signed)
Informed patient of 2 week med refill and he scheduled appointment for 02/06/13

## 2013-02-06 ENCOUNTER — Ambulatory Visit (INDEPENDENT_AMBULATORY_CARE_PROVIDER_SITE_OTHER): Payer: Managed Care, Other (non HMO) | Admitting: Family

## 2013-02-06 ENCOUNTER — Other Ambulatory Visit: Payer: Self-pay | Admitting: Family

## 2013-02-06 ENCOUNTER — Encounter: Payer: Self-pay | Admitting: Family

## 2013-02-06 VITALS — BP 142/82 | HR 100 | Temp 98.7°F | Resp 16 | Wt 190.0 lb

## 2013-02-06 DIAGNOSIS — R509 Fever, unspecified: Secondary | ICD-10-CM

## 2013-02-06 LAB — POCT URINALYSIS DIPSTICK
Leukocytes, UA: NEGATIVE
Protein, UA: NEGATIVE
Urobilinogen, UA: 0.2
pH, UA: 6

## 2013-02-06 LAB — CBC WITH DIFFERENTIAL/PLATELET
Basophils Absolute: 0 10*3/uL (ref 0.0–0.1)
Eosinophils Relative: 2 % (ref 0–5)
HCT: 39.3 % (ref 39.0–52.0)
Lymphocytes Relative: 33 % (ref 12–46)
MCV: 80.2 fL (ref 78.0–100.0)
Monocytes Absolute: 0.3 10*3/uL (ref 0.1–1.0)
RDW: 14.4 % (ref 11.5–15.5)
WBC: 6.1 10*3/uL (ref 4.0–10.5)

## 2013-02-06 NOTE — Progress Notes (Signed)
Subjective:    Patient ID: Mario Ibarra, male    DOB: 05/11/1981, 32 y.o.   MRN: [id number]  HPI  Mr. Mario Ibarra is a 32 yr old male who presents today with chief complaint of fever. He has had low grade fever for 15 days (ranges 99.5-100.5).  Fever is associated with night sweats, mild cough, and anorexia.  Has been needing to take tylenol first thing in the morning. Tylenol does help some with his symptoms.  Denies recent travel, rashes, tick bites, or known family hx of autoimmune disease. Notes that his dog had RMSF last summer.  [place]  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  Wife had mild fever and cough.       Review of Systems  HENT: Negative for congestion.        Mild frontal sinus pressure for a few days.   Eyes:       Dry cough at bedtime.  Brief and only at night.   Gastrointestinal: Negative for vomiting, abdominal pain, constipation and anal bleeding.  Genitourinary: Negative for dysuria and hematuria.  Musculoskeletal: Positive for myalgias and arthralgias.  Skin:       Pt notes some hyperpigmented spots on both shins which have been there "since I went on the blood pressure medicine."   Past Medical History  Diagnosis Date  . History of chicken pox   . Heartburn   . Chicken pox     History   Social History  . Marital Status: Married    Spouse Name: N/A    Number of Children: 1  . Years of Education: N/A   Occupational History  . 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 Smoking status: Never Smoker   . Smokeless tobacco: Never Used  . Alcohol Use: 0.6 oz/week    1 Cans of beer per week  . Drug Use: No  . Sexually Active: Not on file   Other Topics Concern  . Not on file   Social History Narrative   Regular exercise: no   Caffeine: "all the time"     Past Surgical History  Procedure Laterality Date  . Knee surgery  age 27    bone spur? left knee    Family History  Problem Relation Age of Onset   . Diabetes Maternal Grandmother   . Heart disease Maternal Grandmother     CABG x 4  . Diabetes Paternal Grandfather   . Kidney disease Paternal Grandfather     kidney failure due to diabetes?  . Sleep apnea Brother     No Known Allergies  Current Outpatient Prescriptions on File Prior to Visit  Medication Sig Dispense Refill  . amLODipine (NORVASC) 5 MG tablet TAKE 1 TABLET BY MOUTH EVERY DAY  30 tablet  2  . hydrochlorothiazide (HYDRODIURIL) 25 MG tablet TAKE 1 TABLET (25 MG TOTAL) BY MOUTH DAILY.  14 tablet  0  . omeprazole (PRILOSEC) 40 MG capsule Take 1 capsule (40 mg total) by mouth daily.  30 capsule  3   No current facility-administered medications on file prior to visit.    BP 142/82  Pulse 100  Temp(Src) 98.7 F (37.1 C) (Oral)  Resp 16  Wt 190 lb (86.183 kg)  BMI 27.26 kg/m2  SpO2 99%       Objective:   Physical Exam  Constitutional: He is oriented to person, place, and time. He appears well-developed and well-nourished.  No distress.  HENT:  Head: Normocephalic and atraumatic.  Right Ear: Tympanic membrane and ear canal normal.  Left Ear: Tympanic membrane and ear canal normal.  Mouth/Throat: No oropharyngeal exudate, posterior oropharyngeal edema or posterior oropharyngeal erythema.  Cardiovascular: Normal rate and regular rhythm.   No murmur heard. Pulmonary/Chest: Effort normal and breath sounds normal. No respiratory distress. He has no wheezes. He has no rales. He exhibits no tenderness.  Musculoskeletal: He exhibits no edema.  Lymphadenopathy:    He has no cervical adenopathy.  Neurological: He is alert and oriented to person, place, and time.  Skin: Skin is warm and dry.  Psychiatric: He has a normal mood and affect. His behavior is normal. Judgment and thought content normal.          Assessment & Plan:

## 2013-02-06 NOTE — Assessment & Plan Note (Addendum)
Urine dip in office today is negative. Could be viral etiology, however he has been symptomatic for 2 weeks now.  Will check CBC, LFT, BMET.  We discussed HIV screen but he declines.  Will also check EBV, RMSF and Lyme titers, Autoimmune screen- ANA, ESR, RA.  No clear indication for abx at this time.  I have asked the pt to call us if symptoms are not resolved in 1 week. He verbalizes understanding.

## 2013-02-06 NOTE — Patient Instructions (Addendum)
Please complete your lab work prior to leaving. Call if symptoms worsen or if not improved in 1 week.

## 2013-02-07 ENCOUNTER — Telehealth: Payer: Self-pay | Admitting: *Deleted

## 2013-02-07 LAB — EPSTEIN-BARR VIRUS VCA ANTIBODY PANEL
EBV EA IgG: <5 U/mL (ref <9.0)
EBV NA IgG: 40.1 U/mL — ABNORMAL HIGH (ref <18.0)
EBV VCA IgM: <10 U/mL (ref <36.0)

## 2013-02-07 LAB — BASIC METABOLIC PANEL
BUN: 7 mg/dL (ref 6–23)
Calcium: 8.7 mg/dL (ref 8.4–10.5)
Creat: 0.94 mg/dL (ref 0.50–1.35)
Potassium: 3.4 mEq/L — ABNORMAL LOW (ref 3.5–5.3)

## 2013-02-07 LAB — HEPATIC FUNCTION PANEL
ALT: 101 U/L — ABNORMAL HIGH (ref 0–53)
AST: 44 U/L — ABNORMAL HIGH (ref 0–37)
Alkaline Phosphatase: 165 U/L — ABNORMAL HIGH (ref 39–117)
Indirect Bilirubin: 0.5 mg/dL (ref 0.0–0.9)
Total Protein: 7.2 g/dL (ref 6.0–8.3)

## 2013-02-07 MED ORDER — POTASSIUM CHLORIDE CRYS ER 20 MEQ PO TBCR: 20.0000 meq | EXTENDED_RELEASE_TABLET | Freq: Every day | ORAL | Status: DC

## 2013-02-07 NOTE — Telephone Encounter (Signed)
Pt left message requesting lab results.  Please advise.

## 2013-02-07 NOTE — Telephone Encounter (Signed)
Test has been added per [place] at [facility]. Notified pt of results below. He states that he has not run a fever today and denies abdominal pain or n/v. Reports that he feels a little "distorted like my ears are clogged". Denies dizziness, ear pain or decreased hearing. Awaiting additional results.

## 2013-02-07 NOTE — Telephone Encounter (Signed)
LFT's elevated. This is non-specific- likely viral in etiology.  Will add on hepatitis studies (pended below).  ANA (lupus test), RA (rheumatoid test) negative.  Sed rate mildly elevated. This is also non-specific marker for inflammation- can be elevated due to viral illness.  Blood count is normal. Additional lab work is pending.  Potassium is mildly low. I would like him to add Kdur once daily.  Follow up in 1 week, call if abdominal pain, nausea/vomitting or fever >101.

## 2013-02-08 ENCOUNTER — Telehealth: Payer: Self-pay | Admitting: Family

## 2013-02-08 LAB — HEPATITIS PANEL, ACUTE: Hepatitis B Surface Ag: NEGATIVE

## 2013-02-08 NOTE — Telephone Encounter (Signed)
Labs reviewed with pt. He reports feeling better. Recommended follow up in 1-2 weeks.

## 2013-02-08 NOTE — Telephone Encounter (Signed)
Pt left message requesting status of additional labs. PLease advise.

## 2013-02-11 LAB — LYME AB, TOTAL/IGM RESPONSES
Lyme Ab: <0.91 index (ref 0.00–0.90)
Lyme Disease Ab, Quant, IgM: <0.91 index (ref 0.00–0.90)

## 2013-02-11 NOTE — Telephone Encounter (Signed)
Spoke with patient and advised results, pt feeling better and able to eat

## 2013-02-13 ENCOUNTER — Telehealth: Payer: Self-pay | Admitting: *Deleted

## 2013-02-13 ENCOUNTER — Ambulatory Visit (INDEPENDENT_AMBULATORY_CARE_PROVIDER_SITE_OTHER): Payer: Managed Care, Other (non HMO) | Admitting: Family

## 2013-02-13 ENCOUNTER — Encounter: Payer: Self-pay | Admitting: Family

## 2013-02-13 VITALS — BP 134/68 | HR 96 | Temp 99.0°F | Wt 186.1 lb

## 2013-02-13 DIAGNOSIS — R7989 Other specified abnormal findings of blood chemistry: Secondary | ICD-10-CM

## 2013-02-13 DIAGNOSIS — R110 Nausea: Secondary | ICD-10-CM

## 2013-02-13 NOTE — Telephone Encounter (Signed)
Patient scheduled appointment for this afternoon.

## 2013-02-13 NOTE — Telephone Encounter (Signed)
Notified pt with Mellissa response. Transferred to schedulers to make appt...Raechel Chute

## 2013-02-13 NOTE — Patient Instructions (Addendum)
Please call if your symptoms do not continue to improve. Follow up in 2 months.

## 2013-02-13 NOTE — Telephone Encounter (Signed)
Left msg on vm stating want to update Mellissa on his symptoms. Having a lot of nausea before & after he eats. Little appetite. Lost 5lbs since last ov. Last 2 days he states when he wake up the room is spinning. Off balance. Feel like something is most definitely going on. Requesting [employer]...Raechel Chute

## 2013-02-13 NOTE — Assessment & Plan Note (Signed)
Recommended bmet, LFT, abdominal ultrasound and increase omeprazole from 20mg  to 40mg .  Pt refuses further lab work or studies at this time.  After further discussion with pt he reports that he really feels like he is improving and does not want to do any further lab work.

## 2013-02-13 NOTE — Progress Notes (Signed)
Subjective:    Patient ID: Mario Ibarra, male    DOB: 1981/05/16, 32 y.o.   MRN: [id number]  HPI  Mario Ibarra returns today for a 1 week follow up.  Last visit he presented with recent fever, malaise, anorexia.  Lab work was performed and notable only for mild elevation of his LFT's.  He initially was feeling better.  He reports that he has subjective temperature.  Has not checked. He reports ongoing anorexia.  Wakes up with AM nausea.  He has cut back on soda, drinking more water.  Mild tickle in the throat and coughing.  Now coughing throughout the day.  Has been sleeping more, going to bed earlier. Reports that when he gets up in the morning "the room is spinning."  This AM he reports that his balance is off.  "found myself hanging onto the wall."    Review of Systems    see HPI  Past Medical History  Diagnosis Date  . History of chicken pox   . Heartburn   . Chicken pox     History   Social History  . Marital Status: Married    Spouse Name: N/A    Number of Children: 1  . Years of Education: N/A   Occupational History  . 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 Smoking status: Never Smoker   . Smokeless tobacco: Never Used  . Alcohol Use: 0.6 oz/week    1 Cans of beer per week  . Drug Use: No  . Sexually Active: Not on file   Other Topics Concern  . Not on file   Social History Narrative   Regular exercise: no   Caffeine: "all the time"     Past Surgical History  Procedure Laterality Date  . Knee surgery  age 34    bone spur? left knee    Family History  Problem Relation Age of Onset  . Diabetes Maternal Grandmother   . Heart disease Maternal Grandmother     CABG x 4  . Diabetes Paternal Grandfather   . Kidney disease Paternal Grandfather     kidney failure due to diabetes?  . Sleep apnea Brother     No Known Allergies  Current Outpatient Prescriptions on File Prior to Visit  Medication Sig Dispense  Refill  . amLODipine (NORVASC) 5 MG tablet TAKE 1 TABLET BY MOUTH EVERY DAY  30 tablet  2  . hydrochlorothiazide (HYDRODIURIL) 25 MG tablet TAKE 1 TABLET (25 MG TOTAL) BY MOUTH DAILY.  14 tablet  0  . omeprazole (PRILOSEC) 40 MG capsule Take 1 capsule (40 mg total) by mouth daily.  30 capsule  3  . potassium chloride SA (K-DUR,KLOR-CON) 20 MEQ tablet Take 1 tablet (20 mEq total) by mouth daily.  30 tablet  3   No current facility-administered medications on file prior to visit.    BP 134/68  Pulse 96  Temp(Src) 99 F (37.2 C) (Oral)  Wt 186 lb 1.9 oz (84.423 kg)  BMI 26.71 kg/m2  SpO2 96%    Objective:   Physical Exam  Constitutional: He is oriented to person, place, and time. He appears well-developed and well-nourished. No distress.  HENT:  Head: Normocephalic and atraumatic.  Cardiovascular: Normal rate and regular rhythm.   No murmur heard. Pulmonary/Chest: Effort normal and breath sounds normal. No respiratory distress. He has no wheezes. He has no rales. He exhibits no tenderness.  Abdominal:  Soft. Bowel sounds are normal. He exhibits no distension and no mass. There is no tenderness. There is no rebound and no guarding.  Musculoskeletal: He exhibits no edema.  Neurological: He is alert and oriented to person, place, and time.  Psychiatric: He has a normal mood and affect. His behavior is normal. Judgment and thought content normal.          Assessment & Plan:  Dizziness- may be due to mild vertigo. BP looks good today. Monitor.

## 2013-02-13 NOTE — Telephone Encounter (Signed)
Lets bring him back to the office please for office visit.

## 2013-02-20 ENCOUNTER — Other Ambulatory Visit: Payer: Self-pay | Admitting: Family

## 2013-03-27 ENCOUNTER — Other Ambulatory Visit: Payer: Self-pay | Admitting: Family

## 2013-03-27 NOTE — Telephone Encounter (Signed)
Rx request to pharmacy/SLS  

## 2013-04-07 ENCOUNTER — Other Ambulatory Visit: Payer: Self-pay | Admitting: Family

## 2013-04-10 ENCOUNTER — Other Ambulatory Visit: Payer: Self-pay | Admitting: Family

## 2013-04-12 ENCOUNTER — Other Ambulatory Visit: Payer: Self-pay | Admitting: Family

## 2013-05-29 ENCOUNTER — Other Ambulatory Visit: Payer: Self-pay | Admitting: Family

## 2013-06-06 ENCOUNTER — Other Ambulatory Visit: Payer: Self-pay | Admitting: Family

## 2013-06-06 NOTE — Telephone Encounter (Signed)
Rx request to pharmacy; *PATIENT DUE FOR FOLLOW-UP OFFICE VISIT*/SLS    

## 2013-06-17 ENCOUNTER — Other Ambulatory Visit: Payer: Self-pay | Admitting: Family

## 2013-06-18 ENCOUNTER — Other Ambulatory Visit: Payer: Self-pay | Admitting: Family

## 2013-06-18 NOTE — Telephone Encounter (Signed)
Refills sent on potassium on 06/06/13, #30 x 1 refill. Pt is due for follow up appt. Please call pt to arrange.

## 2013-06-18 NOTE — Telephone Encounter (Signed)
Informed patient of medication refill and he scheduled appointment for 07/10/13

## 2013-07-10 ENCOUNTER — Ambulatory Visit (INDEPENDENT_AMBULATORY_CARE_PROVIDER_SITE_OTHER): Payer: Managed Care, Other (non HMO) | Admitting: Family

## 2013-07-10 ENCOUNTER — Encounter: Payer: Self-pay | Admitting: Family

## 2013-07-10 VITALS — BP 124/86 | HR 80 | Temp 97.7°F | Resp 16 | Ht 70.0 in | Wt 188.0 lb

## 2013-07-10 DIAGNOSIS — I10 Essential (primary) hypertension: Secondary | ICD-10-CM

## 2013-07-10 DIAGNOSIS — K219 Gastro-esophageal reflux disease without esophagitis: Secondary | ICD-10-CM

## 2013-07-10 MED ORDER — HYDROCHLOROTHIAZIDE 25 MG PO TABS: 25.0000 mg | ORAL_TABLET | Freq: Every day | ORAL | Status: DC

## 2013-07-10 MED ORDER — POTASSIUM CHLORIDE CRYS ER 20 MEQ PO TBCR: EXTENDED_RELEASE_TABLET | ORAL | Status: DC

## 2013-07-10 NOTE — Progress Notes (Signed)
Subjective:    Patient ID: Mario Ibarra, male    DOB: 1981-02-22, 32 y.o.   MRN: [id number]  HPI  Mr. Jespersen is a 32 yr old male who presents today for follow up of hypertension.  He is maintained on hctz, amlodipine and potassium.  Reports that he has had periods of time that he has been without one med.   He denies SOB,  Denies swelling. He continues potassium but admits to not taking every day.   GERD- He reports that he continues omeprazole PRN- using every other day generally.    Review of Systems See HPI  Past Medical History  Diagnosis Date  . History of chicken pox   . Heartburn   . Chicken pox     History   Social History  . Marital Status: Married    Spouse Name: N/A    Number of Children: 1  . Years of Education: N/A   Occupational History  . 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 Smoking status: Never Smoker   . Smokeless tobacco: Never Used  . Alcohol Use: 0.6 oz/week    1 Cans of beer per week  . Drug Use: No  . Sexual Activity: Not on file   Other Topics Concern  . Not on file   Social History Narrative   Regular exercise: no   Caffeine: "all the time"     Past Surgical History  Procedure Laterality Date  . Knee surgery  age 35    bone spur? left knee    Family History  Problem Relation Age of Onset  . Diabetes Maternal Grandmother   . Heart disease Maternal Grandmother     CABG x 4  . Diabetes Paternal Grandfather   . Kidney disease Paternal Grandfather     kidney failure due to diabetes?  . Sleep apnea Brother     No Known Allergies  Current Outpatient Prescriptions on File Prior to Visit  Medication Sig Dispense Refill  . amLODipine (NORVASC) 5 MG tablet TAKE 1 TABLET BY MOUTH EVERY DAY  30 tablet  2  . omeprazole (PRILOSEC) 40 MG capsule Take 1 capsule (40 mg total) by mouth daily.  30 capsule  3   No current facility-administered medications on file prior to visit.    BP  124/86  Pulse 80  Temp(Src) 97.7 F (36.5 C) (Oral)  Resp 16  Ht 5\' 10"  (1.778 m)  Wt 188 lb 0.6 oz (85.294 kg)  BMI 26.98 kg/m2  SpO2 99%       Objective:   Physical Exam  Constitutional: He is oriented to person, place, and time. He appears well-developed and well-nourished. No distress.  HENT:  Head: Normocephalic and atraumatic.  Cardiovascular: Normal rate and regular rhythm.   No murmur heard. Pulmonary/Chest: Effort normal and breath sounds normal. No respiratory distress. He has no wheezes. He has no rales. He exhibits no tenderness.  Neurological: He is alert and oriented to person, place, and time.  Psychiatric: He has a normal mood and affect. His behavior is normal. Judgment and thought content normal.          Assessment & Plan:

## 2013-07-10 NOTE — Assessment & Plan Note (Signed)
Stable on PPI. Continue same.  

## 2013-07-10 NOTE — Assessment & Plan Note (Signed)
Blood pressure stable on current meds.  I have advised pt to complete bmet.  He cannot complete this AM due to work schedule. I have advised him to return to the lab at his earliest convenience and he verbalizes understanding.

## 2013-07-10 NOTE — Patient Instructions (Signed)
Please return for lab work at your earliest convenience. Follow up in 6 months.

## 2013-07-13 ENCOUNTER — Other Ambulatory Visit: Payer: Self-pay | Admitting: Family

## 2013-07-15 ENCOUNTER — Encounter: Payer: Self-pay | Admitting: Family

## 2013-07-15 NOTE — Telephone Encounter (Signed)
Last Rx to pharmacy 11.12.14, #30x5--Too Soon for refill request/SLS

## 2013-10-06 ENCOUNTER — Other Ambulatory Visit: Payer: Self-pay | Admitting: Family

## 2013-10-07 NOTE — Telephone Encounter (Signed)
Rx request to pharmacy/SLS  

## 2014-01-08 ENCOUNTER — Other Ambulatory Visit: Payer: Self-pay | Admitting: Family

## 2014-01-08 NOTE — Telephone Encounter (Signed)
30 day supply of amlodipine sent to pharmacy. Pt last seen in 06/2013 and advised 6 month f/u. Please call pt to arrange f/u before current supply runs out.

## 2014-01-13 NOTE — Telephone Encounter (Signed)
Informed patient of medication refill and he scheduled appointment for 01/27/14

## 2014-01-16 ENCOUNTER — Other Ambulatory Visit: Payer: Self-pay | Admitting: Family

## 2014-01-16 NOTE — Telephone Encounter (Signed)
Rx request to pharmacy; **Office Visit Needed Prior to Future Refills**/SLS  

## 2014-01-19 ENCOUNTER — Other Ambulatory Visit: Payer: Self-pay | Admitting: Family

## 2014-01-27 ENCOUNTER — Telehealth: Payer: Self-pay | Admitting: Family

## 2014-01-27 ENCOUNTER — Ambulatory Visit (INDEPENDENT_AMBULATORY_CARE_PROVIDER_SITE_OTHER): Payer: Managed Care, Other (non HMO) | Admitting: Family

## 2014-01-27 VITALS — BP 140/84 | HR 72 | Temp 97.8°F | Resp 16 | Ht 70.0 in | Wt 187.0 lb

## 2014-01-27 DIAGNOSIS — E1165 Type 2 diabetes mellitus with hyperglycemia: Secondary | ICD-10-CM

## 2014-01-27 DIAGNOSIS — E1129 Type 2 diabetes mellitus with other diabetic kidney complication: Secondary | ICD-10-CM | POA: Insufficient documentation

## 2014-01-27 DIAGNOSIS — R7989 Other specified abnormal findings of blood chemistry: Secondary | ICD-10-CM

## 2014-01-27 DIAGNOSIS — R945 Abnormal results of liver function studies: Secondary | ICD-10-CM

## 2014-01-27 DIAGNOSIS — I10 Essential (primary) hypertension: Secondary | ICD-10-CM

## 2014-01-27 DIAGNOSIS — R7309 Other abnormal glucose: Secondary | ICD-10-CM

## 2014-01-27 DIAGNOSIS — R739 Hyperglycemia, unspecified: Secondary | ICD-10-CM

## 2014-01-27 DIAGNOSIS — IMO0002 Reserved for concepts with insufficient information to code with codable children: Secondary | ICD-10-CM

## 2014-01-27 HISTORY — DX: Reserved for concepts with insufficient information to code with codable children: IMO0002

## 2014-01-27 HISTORY — DX: Type 2 diabetes mellitus with hyperglycemia: E11.65

## 2014-01-27 MED ORDER — HYDROCHLOROTHIAZIDE 25 MG PO TABS: ORAL_TABLET | ORAL | Status: DC

## 2014-01-27 MED ORDER — OMEPRAZOLE 40 MG PO CPDR: DELAYED_RELEASE_CAPSULE | ORAL | Status: DC

## 2014-01-27 MED ORDER — POTASSIUM CHLORIDE CRYS ER 20 MEQ PO TBCR: EXTENDED_RELEASE_TABLET | ORAL | Status: DC

## 2014-01-27 MED ORDER — AMLODIPINE BESYLATE 5 MG PO TABS: ORAL_TABLET | ORAL | Status: DC

## 2014-01-27 NOTE — Progress Notes (Signed)
Subjective:    Patient ID: Mario Ibarra, male    DOB: 30-Dec-1980, 33 y.o.   MRN: [id number]  HPI  Mario Ibarra is a 33 yr old male who presents today for follow up of his hypertension. Current blood pressure medications include amlodipine and hctz. He is also on Kdur and notes that he sometimes forgets the K Dur.    BP Readings from Last 3 Encounters:  01/27/14 140/84  07/10/13 124/86  02/13/13 134/68   Reports that he was drinking 9 sodas a day and has cut back to 1-2.  Review of Systems See HPI  Past Medical History  Diagnosis Date  . History of chicken pox   . Heartburn   . Chicken pox     History   Social History  . Marital Status: Married    Spouse Name: N/A    Number of Children: 1  . Years of Education: N/A   Occupational History  . 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 Smoking status: Never Smoker   . Smokeless tobacco: Never Used  . Alcohol Use: 0.6 oz/week    1 Cans of beer per week  . Drug Use: No  . Sexual Activity: Not on file   Other Topics Concern  . Not on file   Social History Narrative   Regular exercise: no   Caffeine: "all the time"     Past Surgical History  Procedure Laterality Date  . Knee surgery  age 54    bone spur? left knee    Family History  Problem Relation Age of Onset  . Diabetes Maternal Grandmother   . Heart disease Maternal Grandmother     CABG x 4  . Diabetes Paternal Grandfather   . Kidney disease Paternal Grandfather     kidney failure due to diabetes?  . Sleep apnea Brother     No Known Allergies  No current outpatient prescriptions on file prior to visit.   No current facility-administered medications on file prior to visit.    BP 140/84  Pulse 72  Temp(Src) 97.8 F (36.6 C) (Oral)  Resp 16  Ht 5\' 10"  (1.778 m)  Wt 187 lb (84.823 kg)  BMI 26.83 kg/m2  SpO2 99%       Objective:   Physical Exam  Constitutional: He is oriented to person,  place, and time. He appears well-developed and well-nourished. No distress.  HENT:  Head: Normocephalic and atraumatic.  Cardiovascular: Normal rate and regular rhythm.   No murmur heard. Pulmonary/Chest: Breath sounds normal. No respiratory distress. He has no wheezes. He has no rales. He exhibits no tenderness.  Neurological: He is alert and oriented to person, place, and time.  Psychiatric: He has a normal mood and affect. His behavior is normal. Judgment and thought content normal.          Assessment & Plan:

## 2014-01-27 NOTE — Telephone Encounter (Signed)
Relevant patient education assigned to patient using Emmi. ° °

## 2014-01-27 NOTE — Assessment & Plan Note (Signed)
Hx of elevated LFT during acute illness.  Will repeat LFT.

## 2014-01-27 NOTE — Assessment & Plan Note (Signed)
Hx of mild hyperglycemia. Check A1C.

## 2014-01-27 NOTE — Assessment & Plan Note (Signed)
Blood pressure upper limit of goal.  Advised pt to continue current meds. He is working on limiting his sodium.

## 2014-01-27 NOTE — Progress Notes (Signed)
Pre visit review using our clinic review tool, if applicable. No additional management support is needed unless otherwise documented below in the visit note. 

## 2014-01-27 NOTE — Patient Instructions (Signed)
Please return fasting for complete physical in the next 2 months. Complete lab work prior to leaving.

## 2014-02-05 ENCOUNTER — Other Ambulatory Visit: Payer: Self-pay | Admitting: Family

## 2014-05-29 ENCOUNTER — Other Ambulatory Visit: Payer: Self-pay | Admitting: Family

## 2014-05-30 NOTE — Telephone Encounter (Signed)
Rx request to pharmacy/SLS  

## 2014-07-24 ENCOUNTER — Other Ambulatory Visit: Payer: Self-pay | Admitting: Family

## 2014-07-28 NOTE — Telephone Encounter (Signed)
30 day supply amlodipine and potassium sent to pt's pharmacy. He is past due for fasting physical.  Please call pt to arrange appt before further refills are due.

## 2014-07-30 ENCOUNTER — Other Ambulatory Visit: Payer: Self-pay | Admitting: Family

## 2014-08-20 ENCOUNTER — Encounter (HOSPITAL_BASED_OUTPATIENT_CLINIC_OR_DEPARTMENT_OTHER): Payer: Self-pay | Admitting: *Deleted

## 2014-08-20 ENCOUNTER — Emergency Department (HOSPITAL_BASED_OUTPATIENT_CLINIC_OR_DEPARTMENT_OTHER)
Admission: EM | Admit: 2014-08-20 | Discharge: 2014-08-20 | Disposition: A | Discharge status: Home / Self Care | Payer: Managed Care, Other (non HMO) | Attending: Emergency Medicine | Admitting: Emergency Medicine

## 2014-08-20 ENCOUNTER — Telehealth: Payer: Self-pay | Admitting: Family

## 2014-08-20 DIAGNOSIS — I10 Essential (primary) hypertension: Secondary | ICD-10-CM | POA: Diagnosis not present

## 2014-08-20 DIAGNOSIS — K4090 Unilateral inguinal hernia, without obstruction or gangrene, not specified as recurrent: Secondary | ICD-10-CM | POA: Insufficient documentation

## 2014-08-20 DIAGNOSIS — R1013 Epigastric pain: Secondary | ICD-10-CM

## 2014-08-20 DIAGNOSIS — K219 Gastro-esophageal reflux disease without esophagitis: Secondary | ICD-10-CM | POA: Insufficient documentation

## 2014-08-20 DIAGNOSIS — Z8619 Personal history of other infectious and parasitic diseases: Secondary | ICD-10-CM | POA: Diagnosis not present

## 2014-08-20 DIAGNOSIS — Z79899 Other long term (current) drug therapy: Secondary | ICD-10-CM | POA: Diagnosis not present

## 2014-08-20 HISTORY — DX: Essential (primary) hypertension: I10

## 2014-08-20 LAB — URINALYSIS, ROUTINE W REFLEX MICROSCOPIC
Bilirubin Urine: NEGATIVE
Glucose, UA: NEGATIVE mg/dL
HGB URINE DIPSTICK: NEGATIVE
Ketones, ur: NEGATIVE mg/dL
Leukocytes, UA: NEGATIVE
NITRITE: NEGATIVE
PROTEIN: 30 mg/dL — AB
Specific Gravity, Urine: 1.022 (ref 1.005–1.030)
Urobilinogen, UA: 0.2 mg/dL (ref 0.0–1.0)
pH: 6.5 (ref 5.0–8.0)

## 2014-08-20 LAB — URINE MICROSCOPIC-ADD ON

## 2014-08-20 MED ORDER — FAMOTIDINE 20 MG PO TABS: 20.0000 mg | ORAL_TABLET | Freq: Two times a day (BID) | ORAL | Status: DC

## 2014-08-20 NOTE — Telephone Encounter (Signed)
Patient called in stating that he thinks he has an appendicitis. [facility] and she states that patient should proceed to ED. I informed patient of this and he states that he will go.

## 2014-08-20 NOTE — Telephone Encounter (Signed)
Please contact pt to arrange ED follow up in next 1-2 weeks.

## 2014-08-20 NOTE — Discharge Instructions (Signed)
Take Pepcid as prescribed.  Inguinal Hernia, Adult Muscles help keep everything in the body in its proper place. But if a weak spot in the muscles develops, something can poke through. That is called a hernia. When this happens in the lower part of the belly (abdomen), it is called an inguinal hernia. (It takes its name from a part of the body in this region called the inguinal canal.) A weak spot in the wall of muscles lets some fat or part of the small intestine bulge through. An inguinal hernia can develop at any age. Men get them more often than women. CAUSES  In adults, an inguinal hernia develops over time.  It can be triggered by:  Suddenly straining the muscles of the lower abdomen.  Lifting heavy objects.  Straining to have a bowel movement. Difficult bowel movements (constipation) can lead to this.  Constant coughing. This may be caused by smoking or lung disease.  Being overweight.  Being pregnant.  Working at a job that requires long periods of standing or heavy lifting.  Having had an inguinal hernia before. One type can be an emergency situation. It is called a strangulated inguinal hernia. It develops if part of the small intestine slips through the weak spot and cannot get back into the abdomen. The blood supply can be cut off. If that happens, part of the intestine may die. This situation requires emergency surgery. SYMPTOMS  Often, a small inguinal hernia has no symptoms. It is found when a healthcare provider does a physical exam. Larger hernias usually have symptoms.   In adults, symptoms may include:  A lump in the groin. This is easier to see when the person is standing. It might disappear when lying down.  In men, a lump in the scrotum.  Pain or burning in the groin. This occurs especially when lifting, straining or coughing.  A dull ache or feeling of pressure in the groin.  Signs of a strangulated hernia can include:  A bulge in the groin that becomes  very painful and tender to the touch.  A bulge that turns red or purple.  Fever, nausea and vomiting.  Inability to have a bowel movement or to pass gas. DIAGNOSIS  To decide if you have an inguinal hernia, a healthcare provider will probably do a physical examination.  This will include asking questions about any symptoms you have noticed.  The healthcare provider might feel the groin area and ask you to cough. If an inguinal hernia is felt, the healthcare provider may try to slide it back into the abdomen.  Usually no other tests are needed. TREATMENT  Treatments can vary. The size of the hernia makes a difference. Options include:  Watchful waiting. This is often suggested if the hernia is small and you have had no symptoms.  No medical procedure will be done unless symptoms develop.  You will need to watch closely for symptoms. If any occur, contact your healthcare provider right away.  Surgery. This is used if the hernia is larger or you have symptoms.  Open surgery. This is usually an outpatient procedure (you will not stay overnight in a hospital). An cut (incision) is made through the skin in the groin. The hernia is put back inside the abdomen. The weak area in the muscles is then repaired by herniorrhaphy or hernioplasty. Herniorrhaphy: in this type of surgery, the weak muscles are sewn back together. Hernioplasty: a patch or mesh is used to close the weak area in  the abdominal wall.  Laparoscopy. In this procedure, a surgeon makes small incisions. A thin tube with a tiny video camera (called a laparoscope) is put into the abdomen. The surgeon repairs the hernia with mesh by looking with the video camera and using two long instruments. HOME CARE INSTRUCTIONS   After surgery to repair an inguinal hernia:  You will need to take pain medicine prescribed by your healthcare provider. Follow all directions carefully.  You will need to take care of the wound from the  incision.  Your activity will be restricted for awhile. This will probably include no heavy lifting for several weeks. You also should not do anything too active for a few weeks. When you can return to work will depend on the type of job that you have.  During "watchful waiting" periods, you should:  Maintain a healthy weight.  Eat a diet high in fiber (fruits, vegetables and whole grains).  Drink plenty of fluids to avoid constipation. This means drinking enough water and other liquids to keep your urine clear or pale yellow.  Do not lift heavy objects.  Do not stand for long periods of time.  Quit smoking. This should keep you from developing a frequent cough. SEEK MEDICAL CARE IF:   A bulge develops in your groin area.  You feel pain, a burning sensation or pressure in the groin. This might be worse if you are lifting or straining.  You develop a fever of more than 100.5 F (38.1 C). SEEK IMMEDIATE MEDICAL CARE IF:   Pain in the groin increases suddenly.  A bulge in the groin gets bigger suddenly and does not go down.  For men, there is sudden pain in the scrotum. Or, the size of the scrotum increases.  A bulge in the groin area becomes red or purple and is painful to touch.  You have nausea or vomiting that does not go away.  You feel your heart beating much faster than normal.  You cannot have a bowel movement or pass gas.  You develop a fever of more than 102.0 F (38.9 C). Document Released: 01/01/2009 Document Revised: 11/07/2011 Document Reviewed: 01/01/2009 [facility] Patient Information 2015 [place], [place]. This information is not intended to replace advice given to you by your health care provider. Make sure you discuss any questions you have with your health care provider. Food Choices for Gastroesophageal Reflux Disease When you have gastroesophageal reflux disease (GERD), the foods you eat and your eating habits are very important. Choosing the right foods  can help ease the discomfort of GERD. WHAT GENERAL GUIDELINES DO I NEED TO FOLLOW?  Choose fruits, vegetables, whole grains, low-fat dairy products, and low-fat meat, fish, and poultry.  Limit fats such as oils, salad dressings, butter, nuts, and avocado.  Keep a food diary to identify foods that cause symptoms.  Avoid foods that cause reflux. These may be different for different people.  Eat frequent small meals instead of three large meals each day.  Eat your meals slowly, in a relaxed setting.  Limit fried foods.  Cook foods using methods other than frying.  Avoid drinking alcohol.  Avoid drinking large amounts of liquids with your meals.  Avoid bending over or lying down until 2-3 hours after eating. WHAT FOODS ARE NOT RECOMMENDED? The following are some foods and drinks that may worsen your symptoms: Vegetables Tomatoes. Tomato juice. Tomato and spaghetti sauce. Chili peppers. Onion and garlic. Horseradish. Fruits Oranges, grapefruit, and lemon (fruit and juice). Meats High-fat  meats, fish, and poultry. This includes hot dogs, ribs, ham, sausage, salami, and bacon. Dairy Whole milk and chocolate milk. Sour cream. Cream. Butter. Ice cream. Cream cheese.  Beverages Coffee and tea, with or without caffeine. Carbonated beverages or energy drinks. Condiments Hot sauce. Barbecue sauce.  Sweets/Desserts Chocolate and cocoa. Donuts. Peppermint and spearmint. Fats and Oils High-fat foods, including [place] fries and potato chips. Other Vinegar. Strong spices, such as black pepper, white pepper, red pepper, cayenne, curry powder, cloves, ginger, and chili powder. The items listed above may not be a complete list of foods and beverages to avoid. Contact your dietitian for more information. Document Released: 08/15/2005 Document Revised: 08/20/2013 Document Reviewed: 06/19/2013 [facility] Patient Information 2015 [place]. This information is not intended to replace advice  given to you by your health care provider. Make sure you discuss any questions you have with your health care provider.

## 2014-08-20 NOTE — Telephone Encounter (Signed)
Left detailed message informing patient to call the office to schedule ED follow up visit.

## 2014-08-20 NOTE — ED Notes (Signed)
Per RT pt refused abd pain protocol labs-prefers to wait to be seen EDP

## 2014-08-20 NOTE — ED Provider Notes (Signed)
CSN: [id number]     Arrival date & time 08/20/14  [id number] History   First MD Initiated Contact with Patient 08/20/14 1245     Chief Complaint  Patient presents with  . Abdominal Pain     (Consider location/radiation/quality/duration/timing/severity/associated sxs/prior Treatment) HPI Comments: 33 year old male with a past medical history of GERD and hypertension presenting with suprapubic abdominal pain with urination along with mild epigastric abdominal pain after eating. He cannot pinpoint any specific foods that aggravate his symptoms, however states he does eat a lot of fried and fatty foods. Symptoms have been present for about 5 days, both epigastric and suprapubic pain. Pain is intermittent, nothing in specific makes the symptoms resolve. Denies increased urinary frequency, urgency or dysuria. He states he actually does not have the urge to urinate, however "knows that he needs to urinate". Denies back or flank pain. Denies nausea, vomiting or diarrhea. No fever or chills. He states yesterday he had some pain in his right groin area when he rolled over in bed, and again today while he was driving in the car. Denies testicular pain or swelling. He is monogamous with his wife. He states he takes Prilosec every other day which he has been on for 3 years prescribed by his PCP.  Patient is a 33 y.o. male presenting with abdominal pain. The history is provided by the patient and the spouse.  Abdominal Pain   Past Medical History  Diagnosis Date  . History of chicken pox   . Heartburn   . Chicken pox   . Hypertension    Past Surgical History  Procedure Laterality Date  . Knee surgery  age 33    bone spur? left knee   Family History  Problem Relation Age of Onset  . Diabetes Maternal Grandmother   . Heart disease Maternal Grandmother     CABG x 4  . Diabetes Paternal Grandfather   . Kidney disease Paternal Grandfather     kidney failure due to diabetes?  . Sleep apnea Brother     History  Substance Use Topics  . Smoking status: Never Smoker   . Smokeless tobacco: Never Used  . Alcohol Use: 0.6 oz/week    1 Cans of beer per week    Review of Systems  Gastrointestinal: Positive for abdominal pain.    10 Systems reviewed and are negative for acute change except as noted in the HPI.  Allergies  Review of patient's allergies indicates no known allergies.  Home Medications   Prior to Admission medications   Medication Sig Start Date End Date Taking? Authorizing Provider  amLODipine (NORVASC) 5 MG tablet TAKE 1 TABLET (5 MG TOTAL) BY MOUTH DAILY. 07/28/14   Sandford CrazeMelissa O'Sullivan, NP  famotidine (PEPCID) 20 MG tablet Take 1 tablet (20 mg total) by mouth 2 (two) times daily. 08/20/14   Danaisha Celli M Annabel Gibeau, PA-C  hydrochlorothiazide (HYDRODIURIL) 25 MG tablet TAKE 1 TABLET BY MOUTH EVERY DAY 07/30/14   Sandford CrazeMelissa O'Sullivan, NP  KLOR-CON M20 20 MEQ tablet TAKE 1 TABLET BY MOUTH EVERY DAY 07/28/14   Sandford CrazeMelissa O'Sullivan, NP  omeprazole (PRILOSEC) 40 MG capsule TAKE 1 CAPSULE (40 MG TOTAL) BY MOUTH DAILY. 01/27/14   Sandford CrazeMelissa O'Sullivan, NP   BP 138/80 mmHg  Pulse 84  Temp(Src) 98.1 F (36.7 C) (Oral)  Resp 18  SpO2 98% Physical Exam  Constitutional: He is oriented to person, place, and time. He appears well-developed and well-nourished. No distress.  HENT:  Head: Normocephalic and atraumatic.  Mouth/Throat: Oropharynx  is clear and moist.  Eyes: Conjunctivae are normal.  Neck: Normal range of motion. Neck supple.  Cardiovascular: Normal rate, regular rhythm and normal heart sounds.   Pulmonary/Chest: Effort normal and breath sounds normal.  Abdominal: Soft. Normal appearance and bowel sounds are normal. He exhibits no distension. There is no rigidity, no rebound, no guarding, no tenderness at McBurney's point and negative Murphy's sign. A hernia is present. Hernia confirmed positive in the right inguinal area (reducible).  Minimal suprapubic tenderness. No peritoneal signs.   Musculoskeletal: Normal range of motion. He exhibits no edema.  Neurological: He is alert and oriented to person, place, and time.  Skin: Skin is warm and dry. He is not diaphoretic.  Psychiatric: He has a normal mood and affect. His behavior is normal.  Nursing note and vitals reviewed.   ED Course  Procedures (including critical care time) Labs Review Labs Reviewed  URINALYSIS, ROUTINE W REFLEX MICROSCOPIC - Abnormal; Notable for the following:    Protein, ur 30 (*)    All other components within normal limits  URINE MICROSCOPIC-ADD ON - Abnormal; Notable for the following:    Bacteria, UA FEW (*)    Casts GRANULAR CAST (*)    All other components within normal limits  URINE CULTURE    Imaging Review No results found.   EKG Interpretation None      MDM   Final diagnoses:  Right inguinal hernia  Epigastric abdominal pain   Patient in no apparent distress. Afebrile, vital signs stable. Abdomen is soft, no epigastric tenderness, negative Murphy's sign. Minimal suprapubic tenderness. Urinalysis nitrite negative, few bacteria, 0-2 white blood cells. Urine culture pending. No treatment for UTI at this time. No increased urinary frequency, urgency or dysuria. Regarding his epigastric pain with eating, advised him to take medication for GERD on a daily basis rather than every other day. I also advised against fried and fatty foods which he has been eating. Right inguinal hernia is reducible. Advised surgery follow-up if this continues to cause him problems. Patient does not want any labs drawn at this visit to evaluate LFTs. Follow-up with PCP. Stable for discharge. Return precautions given. Patient states understanding of treatment care plan and is agreeable.  Kathrynn SpeedRobyn M Diem Dicocco, PA-C 08/20/14 1324  Toy CookeyMegan Docherty, MD 08/20/14 [phone]

## 2014-08-20 NOTE — ED Notes (Signed)
Pt amb to triage with quick steady gait in nad. Pt reports pressure to his suprapubic area while urinating, and urgency to urinate x Friday, subjective low grade temps.

## 2014-08-23 ENCOUNTER — Other Ambulatory Visit: Payer: Self-pay | Admitting: Family

## 2014-08-23 NOTE — Telephone Encounter (Signed)
Refill sent but pt is due for follow.

## 2014-08-28 ENCOUNTER — Other Ambulatory Visit: Payer: Self-pay | Admitting: Family

## 2014-08-28 NOTE — Telephone Encounter (Signed)
Med filled.  

## 2014-09-16 ENCOUNTER — Other Ambulatory Visit: Payer: Self-pay | Admitting: Family

## 2014-09-16 NOTE — Telephone Encounter (Signed)
2 week supply of amlodipine and potassium sent to pharmacy. Pt is past due for follow up.  Please call pt to arrange appt in the next 2 weeks and try to place him in a 30 minute slot.  Thanks!

## 2014-09-16 NOTE — Telephone Encounter (Signed)
Informed patient of medication refill and he scheduled appointment for 10/01/14

## 2014-09-27 ENCOUNTER — Other Ambulatory Visit: Payer: Self-pay | Admitting: Family

## 2014-09-29 NOTE — Telephone Encounter (Signed)
Rx request to pharmacy/SLS Requested drug refills are authorized, however, the patient needs further evaluation and/or laboratory testing before further refills are given. Ask him to make an appointment for this.  

## 2014-09-30 ENCOUNTER — Telehealth: Payer: Self-pay | Admitting: Family

## 2014-09-30 MED ORDER — OMEPRAZOLE 40 MG PO CPDR: DELAYED_RELEASE_CAPSULE | ORAL | Status: DC

## 2014-09-30 MED ORDER — AMLODIPINE BESYLATE 5 MG PO TABS: ORAL_TABLET | ORAL | Status: DC

## 2014-09-30 MED ORDER — POTASSIUM CHLORIDE CRYS ER 20 MEQ PO TBCR: 20.0000 meq | EXTENDED_RELEASE_TABLET | Freq: Every day | ORAL | Status: DC

## 2014-09-30 MED ORDER — FAMOTIDINE 20 MG PO TABS: 20.0000 mg | ORAL_TABLET | Freq: Two times a day (BID) | ORAL | Status: DC

## 2014-09-30 NOTE — Telephone Encounter (Signed)
Caller name:Chayne Watt Relationship to patient:Self Can be reached: CBR in chart   Reason for call: PT has appt tomorrow - Pt  Had 2 week refill called in but now is out, requesting that refills be called in for all current meds

## 2014-09-30 NOTE — Telephone Encounter (Signed)
30 day supply sent on all meds except HCTZ as it was sent to pharmacy yesterday. Left detailed message on pt's cell# re: Rx completion and to call if any questions.

## 2014-10-01 ENCOUNTER — Ambulatory Visit (INDEPENDENT_AMBULATORY_CARE_PROVIDER_SITE_OTHER): Payer: Managed Care, Other (non HMO) | Admitting: Family

## 2014-10-01 ENCOUNTER — Encounter: Payer: Self-pay | Admitting: Family

## 2014-10-01 VITALS — BP 120/70 | HR 95 | Temp 97.7°F | Resp 16 | Ht 69.5 in | Wt 195.2 lb

## 2014-10-01 DIAGNOSIS — I10 Essential (primary) hypertension: Secondary | ICD-10-CM

## 2014-10-01 DIAGNOSIS — R739 Hyperglycemia, unspecified: Secondary | ICD-10-CM

## 2014-10-01 DIAGNOSIS — E785 Hyperlipidemia, unspecified: Secondary | ICD-10-CM | POA: Insufficient documentation

## 2014-10-01 DIAGNOSIS — K219 Gastro-esophageal reflux disease without esophagitis: Secondary | ICD-10-CM

## 2014-10-01 MED ORDER — HYDROCHLOROTHIAZIDE 25 MG PO TABS: 25.0000 mg | ORAL_TABLET | Freq: Every day | ORAL | Status: DC

## 2014-10-01 MED ORDER — OMEPRAZOLE 40 MG PO CPDR: DELAYED_RELEASE_CAPSULE | ORAL | Status: DC

## 2014-10-01 MED ORDER — AMLODIPINE BESYLATE 5 MG PO TABS: ORAL_TABLET | ORAL | Status: DC

## 2014-10-01 MED ORDER — POTASSIUM CHLORIDE CRYS ER 20 MEQ PO TBCR: 20.0000 meq | EXTENDED_RELEASE_TABLET | Freq: Every day | ORAL | Status: DC

## 2014-10-01 NOTE — Progress Notes (Signed)
Pre visit review using our clinic review tool, if applicable. No additional management support is needed unless otherwise documented below in the visit note. 

## 2014-10-01 NOTE — Assessment & Plan Note (Signed)
Stable on H2B, continue same

## 2014-10-01 NOTE — Assessment & Plan Note (Signed)
Obtain a1c.  

## 2014-10-01 NOTE — Assessment & Plan Note (Signed)
He is not fasting today, obtain FLP next visit at his cpx.

## 2014-10-01 NOTE — Patient Instructions (Signed)
Please complete lab work prior to leaving. Schedule fasting physical at the front desk.  

## 2014-10-01 NOTE — Progress Notes (Signed)
Subjective:    Patient ID: Mario Ibarra, male    DOB: 1980-10-17, 34 y.o.   MRN: [id number]  HPI  Mr. Bodle is a 34 yr old male who presents today for follow up.  1) HTN-  Patient is currently maintained on the following medications for blood pressure: hctz, amlodipine Patient reports good compliance with blood pressure medications. Patient denies chest pain, shortness of breath or swelling. Last 3 blood pressure readings in our office are as follows: BP Readings from Last 3 Encounters:  10/01/14 120/70  08/20/14 138/80  01/27/14 140/84   2) GERD- maintained on pepcid. Reports that reflux is well controlled.   3) Hyperglycemia-   Lab Results  Component Value Date   HGBA1C 5.7* 12/24/2010   4) Hyperlipidemia-   Lab Results  Component Value Date   CHOL 203* 12/24/2010   HDL 35* 12/24/2010   LDLCALC 125* 12/24/2010   TRIG 217* 12/24/2010   CHOLHDL 5.8 12/24/2010       Review of Systems See HPI  Past Medical History  Diagnosis Date  . History of chicken pox   . Heartburn   . Chicken pox   . Hypertension     History   Social History  . Marital Status: Married    Spouse Name: N/A    Number of Children: 1  . Years of Education: N/A   Occupational History  . 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 Smoking status: Never Smoker   . Smokeless tobacco: Never Used  . Alcohol Use: 0.6 oz/week    1 Cans of beer per week  . Drug Use: No  . Sexual Activity: Not on file   Other Topics Concern  . Not on file   Social History Narrative   Regular exercise: no   Caffeine: "all the time"     Past Surgical History  Procedure Laterality Date  . Knee surgery  age 55    bone spur? left knee    Family History  Problem Relation Age of Onset  . Diabetes Maternal Grandmother   . Heart disease Maternal Grandmother     CABG x 4  . Diabetes Paternal Grandfather   . Kidney disease Paternal Grandfather     kidney  failure due to diabetes?  . Sleep apnea Brother     No Known Allergies  Current Outpatient Prescriptions on File Prior to Visit  Medication Sig Dispense Refill  . famotidine (PEPCID) 20 MG tablet Take 1 tablet (20 mg total) by mouth 2 (two) times daily. 60 tablet 0   No current facility-administered medications on file prior to visit.    BP 120/70 mmHg  Pulse 95  Temp(Src) 97.7 F (36.5 C) (Oral)  Resp 16  Ht 5' 9.5" (1.765 m)  Wt 195 lb 3.2 oz (88.542 kg)  BMI 28.42 kg/m2  SpO2 99%        Objective:   Physical Exam  Constitutional: He is oriented to person, place, and time. He appears well-developed and well-nourished. No distress.  HENT:  Head: Normocephalic and atraumatic.  Cardiovascular: Normal rate and regular rhythm.   No murmur heard. Pulmonary/Chest: Effort normal and breath sounds normal. No respiratory distress. He has no wheezes. He has no rales.  Musculoskeletal: He exhibits no edema.  Neurological: He is alert and oriented to person, place, and time.  Skin: Skin is warm and dry.  Psychiatric: He has a normal mood and  affect. His behavior is normal. Thought content normal.          Assessment & Plan:

## 2014-10-01 NOTE — Assessment & Plan Note (Signed)
Improved on current meds. Continue same.  

## 2014-10-06 ENCOUNTER — Telehealth: Payer: Self-pay | Admitting: Family

## 2014-10-06 NOTE — Telephone Encounter (Signed)
Spoke with pt and scheduled lab visit for 10/10/14.

## 2014-10-06 NOTE — Telephone Encounter (Signed)
Please contact pt and arrange lab appointment.  I will need him to complete lab work ordered at his visit in order to continue to refill his meds.

## 2014-10-10 ENCOUNTER — Other Ambulatory Visit: Payer: Managed Care, Other (non HMO)

## 2014-12-20 ENCOUNTER — Other Ambulatory Visit: Payer: Self-pay | Admitting: Family

## 2014-12-22 NOTE — Telephone Encounter (Signed)
Medication Detail      Disp Refills Start End     omeprazole (PRILOSEC) 40 MG capsule 90 capsule 1 10/01/2014     Sig: TAKE 1 CAPSULE (40 MG TOTAL) BY MOUTH DAILY.    E-Prescribing Status: Receipt confirmed by pharmacy (10/01/2014 8:41 AM EST)     Pharmacy    [facility] [phone] - [place], [place] - [address]   Rx request Denied-too soon for request/SLS

## 2015-03-29 ENCOUNTER — Other Ambulatory Visit: Payer: Self-pay | Admitting: Family

## 2015-04-26 ENCOUNTER — Other Ambulatory Visit: Payer: Self-pay | Admitting: Family

## 2015-04-27 NOTE — Telephone Encounter (Signed)
90 day supply of amlodipine and HCTZ sent to pharmacy. Pt is due for fasting physical with Melissa.  Please call pt to arrange appt.  Thanks!

## 2015-05-18 NOTE — Telephone Encounter (Signed)
Scheduled cpe for 07/06/15

## 2015-06-29 ENCOUNTER — Other Ambulatory Visit: Payer: Self-pay | Admitting: Family

## 2015-07-03 ENCOUNTER — Encounter: Payer: Self-pay | Admitting: Behavioral Health

## 2015-07-03 ENCOUNTER — Telehealth: Payer: Self-pay | Admitting: Behavioral Health

## 2015-07-03 NOTE — Telephone Encounter (Signed)
Pre-Visit Call completed with patient and chart updated.   Pre-Visit Info documented in Specialty Comments under [employer].    

## 2015-07-06 ENCOUNTER — Encounter: Payer: Self-pay | Admitting: Family

## 2015-07-06 ENCOUNTER — Telehealth: Payer: Self-pay | Admitting: Family

## 2015-07-06 ENCOUNTER — Ambulatory Visit (INDEPENDENT_AMBULATORY_CARE_PROVIDER_SITE_OTHER): Payer: Managed Care, Other (non HMO) | Admitting: Family

## 2015-07-06 VITALS — BP 118/78 | HR 60 | Temp 98.4°F | Resp 16 | Ht 69.5 in | Wt 191.4 lb

## 2015-07-06 DIAGNOSIS — R7989 Other specified abnormal findings of blood chemistry: Secondary | ICD-10-CM

## 2015-07-06 DIAGNOSIS — E785 Hyperlipidemia, unspecified: Secondary | ICD-10-CM

## 2015-07-06 DIAGNOSIS — Z0000 Encounter for general adult medical examination without abnormal findings: Secondary | ICD-10-CM

## 2015-07-06 DIAGNOSIS — R739 Hyperglycemia, unspecified: Secondary | ICD-10-CM

## 2015-07-06 LAB — CBC WITH DIFFERENTIAL/PLATELET
BASOS ABS: 0 10*3/uL (ref 0.0–0.1)
Basophils Relative: 0.4 % (ref 0.0–3.0)
EOS ABS: 0.2 10*3/uL (ref 0.0–0.7)
Eosinophils Relative: 1.7 % (ref 0.0–5.0)
HEMATOCRIT: 46.9 % (ref 39.0–52.0)
HEMOGLOBIN: 16 g/dL (ref 13.0–17.0)
Lymphocytes Relative: 34.4 % (ref 12.0–46.0)
Lymphs Abs: 3.3 10*3/uL (ref 0.7–4.0)
MCHC: 34.2 g/dL (ref 30.0–36.0)
MCV: 83.8 fl (ref 78.0–100.0)
MONOS PCT: 5.1 % (ref 3.0–12.0)
Monocytes Absolute: 0.5 10*3/uL (ref 0.1–1.0)
Neutro Abs: 5.6 10*3/uL (ref 1.4–7.7)
Neutrophils Relative %: 58.4 % (ref 43.0–77.0)
Platelets: 276 10*3/uL (ref 150.0–400.0)
RBC: 5.59 Mil/uL (ref 4.22–5.81)
RDW: 13.2 % (ref 11.5–15.5)
WBC: 9.6 10*3/uL (ref 4.0–10.5)

## 2015-07-06 LAB — BASIC METABOLIC PANEL
BUN: 12 mg/dL (ref 6–23)
CO2: 30 meq/L (ref 19–32)
CREATININE: 1.07 mg/dL (ref 0.40–1.50)
Calcium: 10 mg/dL (ref 8.4–10.5)
Chloride: 101 mEq/L (ref 96–112)
GFR: 83.21 mL/min (ref >60.00)
GLUCOSE: 140 mg/dL — AB (ref 70–99)
POTASSIUM: 4.3 meq/L (ref 3.5–5.1)
Sodium: 139 mEq/L (ref 135–145)

## 2015-07-06 LAB — HEPATIC FUNCTION PANEL
ALT: 65 U/L — AB (ref 0–53)
AST: 31 U/L (ref 0–37)
Albumin: 4.4 g/dL (ref 3.5–5.2)
Alkaline Phosphatase: 113 U/L (ref 39–117)
Bilirubin, Direct: 0.1 mg/dL (ref 0.0–0.3)
TOTAL PROTEIN: 7.8 g/dL (ref 6.0–8.3)
Total Bilirubin: 0.5 mg/dL (ref 0.2–1.2)

## 2015-07-06 LAB — URINALYSIS, ROUTINE W REFLEX MICROSCOPIC
Bilirubin Urine: NEGATIVE
Hgb urine dipstick: NEGATIVE
Ketones, ur: NEGATIVE
Leukocytes, UA: NEGATIVE
Nitrite: NEGATIVE
PH: 6.5 (ref 5.0–8.0)
RBC / HPF: NONE SEEN (ref 0–?)
SPECIFIC GRAVITY, URINE: 1.02 (ref 1.000–1.030)
Total Protein, Urine: NEGATIVE
UROBILINOGEN UA: 0.2 (ref 0.0–1.0)
Urine Glucose: NEGATIVE
WBC, UA: NONE SEEN (ref 0–?)

## 2015-07-06 LAB — LIPID PANEL
CHOLESTEROL: 174 mg/dL (ref 0–200)
HDL: 35.3 mg/dL — ABNORMAL LOW (ref >39.00)
NonHDL: 138.58
Total CHOL/HDL Ratio: 5
Triglycerides: 249 mg/dL — ABNORMAL HIGH (ref 0.0–149.0)
VLDL: 49.8 mg/dL — ABNORMAL HIGH (ref 0.0–40.0)

## 2015-07-06 LAB — TSH: TSH: 2.92 u[IU]/mL (ref 0.35–4.50)

## 2015-07-06 LAB — LDL CHOLESTEROL, DIRECT: Direct LDL: 97 mg/dL

## 2015-07-06 LAB — HEMOGLOBIN A1C: Hgb A1c MFr Bld: 6.2 % (ref 4.6–6.5)

## 2015-07-06 MED ORDER — OMEPRAZOLE 40 MG PO CPDR: DELAYED_RELEASE_CAPSULE | ORAL | Status: DC

## 2015-07-06 MED ORDER — HYDROCHLOROTHIAZIDE 25 MG PO TABS
ORAL_TABLET | ORAL | Status: DC
Start: 2015-07-06 — End: 2016-01-04

## 2015-07-06 MED ORDER — POTASSIUM CHLORIDE CRYS ER 20 MEQ PO TBCR: EXTENDED_RELEASE_TABLET | ORAL | Status: DC

## 2015-07-06 NOTE — Telephone Encounter (Signed)
Sugar has gone up some- currently in borderline diabetes range.  Advise pt on exercise, modest weight loss 10-15 pounds and add fish oil 2000mg  twice daily for elevated triglycerides.  Also advise on the following dietary changes.  Please work on avoiding concentrated sweets, and limiting white carbs (rice/bread/pasta/potatoes). Instead substitute whole grain versions with reasonable portions.

## 2015-07-06 NOTE — Progress Notes (Signed)
Subjective:    Patient ID: Mario Ibarra, male    DOB: 1980-12-15, 33 y.o.   MRN: [id number]  HPI  Patient presents today for complete physical.  Immunizations: tetanus up to date, declines flu shot Diet: reports diet is healthy.  Wt Readings from Last 3 Encounters:  07/06/15 191 lb 6.4 oz (86.818 kg)  10/01/14 195 lb 3.2 oz (88.542 kg)  01/27/14 187 lb (84.823 kg)  Exercise: no, plans to sign up for exercise program Dental:  Up to date  Review of Systems  Constitutional: Negative for unexpected weight change.  HENT: Negative for hearing loss and rhinorrhea.   Eyes: Negative for visual disturbance.  Respiratory: Negative for cough.   Cardiovascular: Negative for leg swelling.  Gastrointestinal: Negative for constipation.       + watery stools after each meal. (for years)  Genitourinary: Negative for dysuria and frequency.  Musculoskeletal: Negative for myalgias and arthralgias.  Skin: Negative for rash.  Neurological: Negative for headaches.  Hematological: Negative for adenopathy.  Psychiatric/Behavioral:       Denies depression/anxiety   Past Medical History  Diagnosis Date  . History of chicken pox   . Heartburn   . Chicken pox   . Hypertension     Social History   Social History  . Marital Status: Married    Spouse Name: N/A  . Number of Children: 1  . Years of Education: N/A   Occupational History  . 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 Smoking status: Never Smoker   . Smokeless tobacco: Never Used  . Alcohol Use: 0.6 oz/week    1 Cans of beer per week  . Drug Use: No  . Sexual Activity: Not on file   Other Topics Concern  . Not on file   Social History Narrative   Regular exercise: no   Caffeine: "all the time"     Past Surgical History  Procedure Laterality Date  . Knee surgery  age 55    bone spur? left knee    Family History  Problem Relation Age of Onset  . Diabetes Maternal  Grandmother   . Heart disease Maternal Grandmother     CABG x 4  . Diabetes Paternal Grandfather   . Kidney disease Paternal Grandfather     kidney failure due to diabetes?  . Sleep apnea Brother     No Known Allergies  Current Outpatient Prescriptions on File Prior to Visit  Medication Sig Dispense Refill  . amLODipine (NORVASC) 5 MG tablet TAKE 1 TABLET (5 MG TOTAL) BY MOUTH DAILY. 90 tablet 0  . hydrochlorothiazide (HYDRODIURIL) 25 MG tablet TAKE 1 TABLET (25 MG TOTAL) BY MOUTH DAILY. 90 tablet 0  . KLOR-CON M20 20 MEQ tablet TAKE 1 TABLET (20 MEQ TOTAL) BY MOUTH DAILY. 90 tablet 0  . omeprazole (PRILOSEC) 40 MG capsule TAKE 1 CAPSULE (40 MG TOTAL) BY MOUTH DAILY. 90 capsule 1   No current facility-administered medications on file prior to visit.    Pulse 60  Temp(Src) 98.4 F (36.9 C) (Oral)  Resp 16  Ht 5' 9.5" (1.765 m)  Wt 191 lb 6.4 oz (86.818 kg)  BMI 27.87 kg/m2  SpO2 100%       Objective:   Physical Exam Physical Exam  Constitutional: He is oriented to person, place, and time. He appears well-developed and well-nourished. No distress.  HENT:  Head: Normocephalic and atraumatic.  Right Ear:  Tympanic membrane and ear canal normal.  Left Ear: Tympanic membrane and ear canal normal.  Mouth/Throat: Oropharynx is clear and moist.  Eyes: Pupils are equal, round, and reactive to light. No scleral icterus.  Neck: Normal range of motion. No thyromegaly present.  Cardiovascular: Normal rate and regular rhythm.   No murmur heard. Pulmonary/Chest: Effort normal and breath sounds normal. No respiratory distress. He has no wheezes. He has no rales. He exhibits no tenderness.  Abdominal: Soft. Bowel sounds are normal. He exhibits no distension and no mass. There is no tenderness. There is no rebound and no guarding.  Musculoskeletal: He exhibits no edema.  Lymphadenopathy:    He has no cervical adenopathy.  Neurological: He is alert and oriented to person, place, and  time. He has normal patellar reflexes. He exhibits normal muscle tone. Coordination normal.  Skin: Skin is warm and dry.  Psychiatric: He has a normal mood and affect. His behavior is normal. Judgment and thought content normal.          Assessment & Plan:   Preventative Health Care- discussed adding regular exercise.  Tetanus up to date. Obtain routine lab tests. Continue healthy diet,        Assessment & Plan:

## 2015-07-06 NOTE — Progress Notes (Signed)
Pre visit review using our clinic review tool, if applicable. No additional management support is needed unless otherwise documented below in the visit note. 

## 2015-07-06 NOTE — Patient Instructions (Signed)
Eliminate dairy from your diet and see if your diarrhea improves.  If not let me know and I will arrange referral to Gastroenterology.

## 2015-07-20 NOTE — Telephone Encounter (Signed)
Notified pt and he voices understanding. 

## 2015-07-27 ENCOUNTER — Other Ambulatory Visit: Payer: Self-pay | Admitting: Family

## 2015-07-28 NOTE — Telephone Encounter (Signed)
Medication Detail      Disp Refills Start End     amLODipine (NORVASC) 5 MG tablet 90 tablet 0 06/29/2015     Sig: TAKE 1 TABLET (5 MG TOTAL) BY MOUTH DAILY.    E-Prescribing Status: Receipt confirmed by pharmacy (06/29/2015 4:34 PM EDT)     Pharmacy    [facility] [phone][facility][id number] - [place], [place] - [address]   Spoke with pharmacy and they informed that patient was only able to p/u #30 on 06/29/15 Rx; sent new Rx to pharmacy for#30 with 2 refills/SLS

## 2015-08-27 ENCOUNTER — Other Ambulatory Visit: Payer: Self-pay | Admitting: Family

## 2015-11-20 ENCOUNTER — Telehealth: Payer: Self-pay | Admitting: Family Medicine

## 2015-11-20 ENCOUNTER — Ambulatory Visit (INDEPENDENT_AMBULATORY_CARE_PROVIDER_SITE_OTHER)
Admission: RE | Admit: 2015-11-20 | Discharge: 2015-11-20 | Disposition: A | Discharge status: Home / Self Care | Payer: Managed Care, Other (non HMO) | Source: Ambulatory Visit | Attending: Family Medicine | Admitting: Family Medicine

## 2015-11-20 ENCOUNTER — Encounter: Payer: Self-pay | Admitting: Family Medicine

## 2015-11-20 ENCOUNTER — Ambulatory Visit (INDEPENDENT_AMBULATORY_CARE_PROVIDER_SITE_OTHER): Payer: Managed Care, Other (non HMO) | Admitting: Family Medicine

## 2015-11-20 VITALS — BP 130/86 | HR 92 | Temp 98.1°F | Resp 20 | Ht 69.5 in | Wt 187.5 lb

## 2015-11-20 DIAGNOSIS — R109 Unspecified abdominal pain: Secondary | ICD-10-CM | POA: Insufficient documentation

## 2015-11-20 DIAGNOSIS — R1030 Lower abdominal pain, unspecified: Secondary | ICD-10-CM

## 2015-11-20 LAB — CBC WITH DIFFERENTIAL/PLATELET
BASOS PCT: 0.3 % (ref 0.0–3.0)
Basophils Absolute: 0 10*3/uL (ref 0.0–0.1)
EOS ABS: 0.1 10*3/uL (ref 0.0–0.7)
Eosinophils Relative: 0.8 % (ref 0.0–5.0)
HEMATOCRIT: 45.6 % (ref 39.0–52.0)
Hemoglobin: 15.3 g/dL (ref 13.0–17.0)
Lymphocytes Relative: 14.6 % (ref 12.0–46.0)
Lymphs Abs: 1.6 10*3/uL (ref 0.7–4.0)
MCHC: 33.6 g/dL (ref 30.0–36.0)
MCV: 84 fl (ref 78.0–100.0)
MONO ABS: 0.4 10*3/uL (ref 0.1–1.0)
Monocytes Relative: 3.6 % (ref 3.0–12.0)
NEUTROS ABS: 8.6 10*3/uL — AB (ref 1.4–7.7)
Neutrophils Relative %: 80.7 % — ABNORMAL HIGH (ref 43.0–77.0)
PLATELETS: 295 10*3/uL (ref 150.0–400.0)
RBC: 5.43 Mil/uL (ref 4.22–5.81)
RDW: 13.9 % (ref 11.5–15.5)
WBC: 10.7 10*3/uL — AB (ref 4.0–10.5)

## 2015-11-20 LAB — BASIC METABOLIC PANEL
BUN: 16 mg/dL (ref 6–23)
CO2: 30 mEq/L (ref 19–32)
Calcium: 9.5 mg/dL (ref 8.4–10.5)
Chloride: 94 mEq/L — ABNORMAL LOW (ref 96–112)
Creatinine, Ser: 1.1 mg/dL (ref 0.40–1.50)
GFR: 80.42 mL/min (ref >60.00)
Glucose, Bld: 179 mg/dL — ABNORMAL HIGH (ref 70–99)
POTASSIUM: 3.6 meq/L (ref 3.5–5.1)
Sodium: 136 mEq/L (ref 135–145)

## 2015-11-20 MED ORDER — TRAMADOL HCL 50 MG PO TABS: 50.0000 mg | ORAL_TABLET | Freq: Three times a day (TID) | ORAL | Status: DC | PRN

## 2015-11-20 NOTE — Progress Notes (Signed)
Patient ID: Mario Ibarra, male   DOB: Aug 17, 1981, 35 y.o.   MRN: [id number]    ASHWATH CHAU , 04/16/1981, 35 y.o., male MRN: [id number]  CC: abdominal pain  Subjective: Pt presents for an acute OV, and transfer of care from another [place] provider. Patient presents  with complaints of abdominal pain of 4 days duration. Patient states the pain started just below his umbilicus, when he woke up attempted to sit up. Pain then radiated to "behind" his penis. Patient denies radiation into his scrotal area. He points to his right lower quadrant as the location of the majority of his pain. He does endorse chills on Tuesday, the first day the pain started. He denies any fever, nausea, vomit. Patient states the pain is mildly improved when laying flat, worse with movement. He states when he is at its worse, it was painful to even walk. Patient has tried cool compresses to the area, and it felt mildly improved. He states it feels like there is a "ball" of discomfort. He does endorse sharp pain on the first day, and that made him not want to eat, otherwise the pain has been dull and consistent. He felt that there were "dice "rolling around in his colon. Patient reports decreased appetite initially, but has returned to his routine diet yesterday evening. Patient denies any skin changes, bowel changes, melena or hematochezia. He states today he experienced a "coughing fit "and he felt that the pain "released". Patient denies original injury, straining with bowel movements, lifting anything heavy. He states his bowel movements have not changed. He frequently experiences loose stools, that "might "have increased during this episode. Patient was seen in 2015 in the emergency room with reported reducible hernia, without imaging. He endorses a mild repeat of those symptoms approximately 1 year ago.  No Known Allergies Social History  Substance Use Topics  . Smoking status: Never Smoker   . Smokeless tobacco: Never Used  .  Alcohol Use: 0.6 oz/week    1 Cans of beer per week   Past Medical History  Diagnosis Date  . History of chicken pox   . Heartburn   . Chicken pox   . Hypertension    Past Surgical History  Procedure Laterality Date  . Knee surgery  age 72    bone spur? left knee   Family History  Problem Relation Age of Onset  . Diabetes Maternal Grandmother   . Heart disease Maternal Grandmother     CABG x 4  . Diabetes Paternal Grandfather   . Kidney disease Paternal Grandfather     kidney failure due to diabetes?  . Sleep apnea Brother      Medication List       This list is accurate as of: 11/20/15  9:19 AM.  Always use your most recent med list.               amLODipine 5 MG tablet  Commonly known as:  NORVASC  TAKE 1 TABLET (5 MG TOTAL) BY MOUTH DAILY.     Fish Oil 1000 MG Caps  Take 2,000 mg by mouth 2 (two) times daily. Reported on 11/20/2015     hydrochlorothiazide 25 MG tablet  Commonly known as:  HYDRODIURIL  TAKE 1 TABLET (25 MG TOTAL) BY MOUTH DAILY.     omeprazole 40 MG capsule  Commonly known as:  PRILOSEC  TAKE 1 CAPSULE (40 MG TOTAL) BY MOUTH DAILY.     potassium chloride SA 20 MEQ tablet  Commonly known as:  KLOR-CON M20  TAKE 1 TABLET (20 MEQ TOTAL) BY MOUTH DAILY.         ROS: Negative, with the exception of above mentioned in HPI   Objective:  BP 130/86 mmHg  Pulse 92  Temp(Src) 98.1 F (36.7 C) (Oral)  Resp 20  Ht 5' 9.5" (1.765 m)  Wt 187 lb 8 oz (85.049 kg)  BMI 27.30 kg/m2  SpO2 97% Body mass index is 27.3 kg/(m^2). Gen: Afebrile. No acute distress. Nontoxic in appearance well developed,well nourished, caucasian male.  HENT: AT. Gantt.  MMM, no oral lesions. Eyes:Pupils Equal Round Reactive to light, Extraocular movements intact,  Conjunctiva without redness, discharge or icterus. CV: RRR  Chest: CTAB, no wheeze or crackles. Good air movement, normal resp effort.  Abd: Asymmetry noted R>L lower abd (supine and standing). Soft, Round.  Diffuse Mild tenderness, moderate RLQ tenderness to deep palpation. ND. BS hyperactive. Papable Mass RLQ.  No rebound. Mildly Guarded. No HSM. Skin: Abd striae present. No rashes, purpura or petechiae. No erythema, bruising or skin changes.  GU: no scrotal or penile changes. No swelling. Mild inguinal fullness. Neuro: slow gait. PERLA. EOMi. Alert. Oriented x3  Psych: Normal affect, dress and demeanor. Normal speech. Normal thought content and judgment..   Assessment/Plan: ZARON VOELTZ is a 35 y.o. male present for acute OV for establishment of care and compliant of abd pain Lower abdominal pain; suspect hernia - Palpable defect/mass? Right lower abdomen. Concern for hernia, abdomen exam with mild discomfort on light palpation and moderate discomfort with deep palpation. Will likely need to proceed to CT scan therefore will collect BMP today and CBC.  - DG Abd 2 Views; Future - CBC w/Diff - Basic Metabolic Panel (BMET) - Discussed red flags in detail. Including  symptoms of hernias and when to seek emergent care.  - AVS info on hernia and written instructions provided.   - traMADol (ULTRAM) 50 MG tablet; Take 1 tablet (50 mg total) by mouth every 8 (eight) hours as needed.  Dispense: 30 tablet; Refill: 0  electronically signed by:  Felix Pacini, DO  [facility] - [place]

## 2015-11-20 NOTE — Patient Instructions (Signed)
Hernia, Adult A hernia is the bulging of an organ or tissue through a weak spot in the muscles of the abdomen (abdominal wall). Hernias develop most often near the navel or groin. There are many kinds of hernias. Common kinds include:  Femoral hernia. This kind of hernia develops under the groin in the upper thigh area.  Inguinal hernia. This kind of hernia develops in the groin or scrotum.  Umbilical hernia. This kind of hernia develops near the navel.  Hiatal hernia. This kind of hernia causes part of the stomach to be pushed up into the chest.  Incisional hernia. This kind of hernia bulges through a scar from an abdominal surgery. CAUSES This condition may be caused by:  Heavy lifting.  Coughing over a long period of time.  Straining to have a bowel movement.  An incision made during an abdominal surgery.  A birth defect (congenital defect).  Excess weight or obesity.  Smoking.  Poor nutrition.  Cystic fibrosis.  Excess fluid in the abdomen.  Undescended testicles. SYMPTOMS Symptoms of a hernia include:  A lump on the abdomen. This is the first sign of a hernia. The lump may become more obvious with standing, straining, or coughing. It may get bigger over time if it is not treated or if the condition causing it is not treated.  Pain. A hernia is usually painless, but it may become painful over time if treatment is delayed. The pain is usually dull and may get worse with standing or lifting heavy objects. Sometimes a hernia gets tightly squeezed in the weak spot (strangulated) or stuck there (incarcerated) and causes additional symptoms. These symptoms may include:  Vomiting.  Nausea.  Constipation.  Irritability. DIAGNOSIS A hernia may be diagnosed with:  A physical exam. During the exam your health care provider may ask you to cough or to make a specific movement, because a hernia is usually more visible when you move.  Imaging tests. These can  include:  X-rays.  Ultrasound.  CT scan. TREATMENT A hernia that is small and painless may not need to be treated. A hernia that is large or painful may be treated with surgery. Inguinal hernias may be treated with surgery to prevent incarceration or strangulation. Strangulated hernias are always treated with surgery, because lack of blood to the trapped organ or tissue can cause it to die. Surgery to treat a hernia involves pushing the bulge back into place and repairing the weak part of the abdomen. HOME CARE INSTRUCTIONS  Avoid straining.  Do not lift anything heavier than 10 lb (4.5 kg).  Lift with your leg muscles, not your back muscles. This helps avoid strain.  When coughing, try to cough gently.  Prevent constipation. Constipation leads to straining with bowel movements, which can make a hernia worse or cause a hernia repair to break down. You can prevent constipation by:  Eating a high-fiber diet that includes plenty of fruits and vegetables.  Drinking enough fluids to keep your urine clear or pale yellow. Aim to drink 6-8 glasses of water per day.  Using a stool softener as directed by your health care provider.  Lose weight, if you are overweight.  Do not use any tobacco products, including cigarettes, chewing tobacco, or electronic cigarettes. If you need help quitting, ask your health care provider.  Keep all follow-up visits as directed by your health care provider. This is important. Your health care provider may need to monitor your condition. SEEK MEDICAL CARE IF:  You have   swelling, redness, and pain in the affected area.  Your bowel habits change. SEEK IMMEDIATE MEDICAL CARE IF:  You have a fever.  You have abdominal pain that is getting worse.  You feel nauseous or you vomit.  You cannot push the hernia back in place by gently pressing on it while you are lying down.  The hernia:  Changes in shape or size.  Is stuck outside the  abdomen.  Becomes discolored.  Feels hard or tender.   This information is not intended to replace advice given to you by your health care provider. Make sure you discuss any questions you have with your health care provider.   Document Released: 08/15/2005 Document Revised: 09/05/2014 Document Reviewed: 06/25/2014 Elsevier Interactive Patient Education 2016 [employer]   Obtain Xray today, may need to proceed with CT depending on results.  Tramadol for pain if needed.  Monitor area for emergency signs we discussed toda (fever, skin changes, changes in bowel movements, increased pain, nausea, vomit etc)

## 2015-11-20 NOTE — Telephone Encounter (Signed)
Please call patient, his x-ray did not give us reason for his pain. As we discussed during his office visit, I suspected we would likely have to proceed with a CT scan. I have placed this order for him today. He did have a very mild elevation in white count, which could be secondary to an inflammatory process from his bowels versus early infection. Please encourage patient to follow-up, 1-2 days after CT scan is completed to discuss all imaging studies and further plan.

## 2015-11-23 NOTE — Telephone Encounter (Signed)
Spoke with patient reviewed results and instructions with patient. Patient verbalized understanding. 

## 2015-11-24 ENCOUNTER — Ambulatory Visit (HOSPITAL_BASED_OUTPATIENT_CLINIC_OR_DEPARTMENT_OTHER): Payer: Managed Care, Other (non HMO)

## 2016-01-04 ENCOUNTER — Ambulatory Visit (INDEPENDENT_AMBULATORY_CARE_PROVIDER_SITE_OTHER): Payer: Managed Care, Other (non HMO) | Admitting: Family

## 2016-01-04 ENCOUNTER — Encounter: Payer: Self-pay | Admitting: Family

## 2016-01-04 VITALS — BP 135/82 | HR 66 | Temp 97.6°F | Resp 18 | Ht 69.5 in | Wt 190.0 lb

## 2016-01-04 DIAGNOSIS — Z0000 Encounter for general adult medical examination without abnormal findings: Secondary | ICD-10-CM

## 2016-01-04 DIAGNOSIS — R739 Hyperglycemia, unspecified: Secondary | ICD-10-CM

## 2016-01-04 DIAGNOSIS — R200 Anesthesia of skin: Secondary | ICD-10-CM

## 2016-01-04 DIAGNOSIS — E785 Hyperlipidemia, unspecified: Secondary | ICD-10-CM

## 2016-01-04 DIAGNOSIS — R208 Other disturbances of skin sensation: Secondary | ICD-10-CM

## 2016-01-04 DIAGNOSIS — I10 Essential (primary) hypertension: Secondary | ICD-10-CM

## 2016-01-04 MED ORDER — MELOXICAM 7.5 MG PO TABS: 7.5000 mg | ORAL_TABLET | Freq: Every day | ORAL | Status: DC

## 2016-01-04 MED ORDER — AMLODIPINE BESYLATE 5 MG PO TABS: ORAL_TABLET | ORAL | Status: DC

## 2016-01-04 MED ORDER — HYDROCHLOROTHIAZIDE 25 MG PO TABS: ORAL_TABLET | ORAL | Status: DC

## 2016-01-04 NOTE — Progress Notes (Signed)
Pre visit review using our clinic review tool, if applicable. No additional management support is needed unless otherwise documented below in the visit note. 

## 2016-01-04 NOTE — Assessment & Plan Note (Signed)
Obtain a1C.

## 2016-01-04 NOTE — Assessment & Plan Note (Signed)
BP stable, continue current meds, obtain bmet.  

## 2016-01-04 NOTE — Progress Notes (Addendum)
Subjective:    Patient ID: Mario Ibarra, male    DOB: 1980/12/09, 35 y.o.   MRN: [id number]  HPI  Mario Ibarra is a 35 yr old male who presents today for follow up.  1) HTN- current BP meds include amlodipine, hctz. He is also maintained on Kdur. Reports that he did not take AM meds today.  Overall good compliance.  BP Readings from Last 3 Encounters:  01/04/16 135/82  11/20/15 130/86  07/06/15 118/78   2) Hyperlipidemia- not on statin, never started fish oil.  Lab Results  Component Value Date   CHOL 174 07/06/2015   HDL 35.30* 07/06/2015   LDLCALC 125* 12/24/2010   LDLDIRECT 97.0 07/06/2015   TRIG 249.0* 07/06/2015   CHOLHDL 5 07/06/2015    3) Hyperglycemia- reports 1-2 sodas a day.  Used to drink 10 a day.   Wt Readings from Last 3 Encounters:  01/04/16 190 lb (86.183 kg)  11/20/15 187 lb 8 oz (85.049 kg)  07/06/15 191 lb 6.4 oz (86.818 kg)    Lab Results  Component Value Date   HGBA1C 6.2 07/06/2015   4) Numbness-  Reports bilateral great toe numbness x 4 weeks.  Denies low back pain, radiculopathy. Denies bowel or bladder incontinence.   5) GERD- reports "perfect" control with PPI.    Review of Systems See HPI  Past Medical History  Diagnosis Date  . History of chicken pox   . Heartburn   . Chicken pox   . Hypertension      Social History   Social History  . Marital Status: Married    Spouse Name: N/A  . Number of Children: 1  . Years of Education: N/A   Occupational History  . 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 Smoking status: Never Smoker   . Smokeless tobacco: Never Used  . Alcohol Use: 0.6 oz/week    1 Cans of beer per week  . Drug Use: No  . Sexual Activity: Yes    Birth Control/ Protection: None   Other Topics Concern  . Not on file   Social History Narrative   Married to [place]. 2 children [place] and [place].   Some college. 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   Drinks caffeinated  beverages. Takes a daily vitamin.   Wears his seatbelt, smoke detector in the home.    Regular exercise: no       Past Surgical History  Procedure Laterality Date  . Knee surgery  age 39    bone spur? left knee    Family History  Problem Relation Age of Onset  . Diabetes Maternal Grandmother   . Heart disease Maternal Grandmother     CABG x 4  . Diabetes Paternal Grandfather   . Kidney disease Paternal Grandfather     kidney failure due to diabetes?  . Sleep apnea Brother   . Diabetes Maternal Aunt   . Heart disease Maternal Uncle   . Cancer Paternal Uncle   . Diabetes Paternal Uncle     No Known Allergies  Current Outpatient Prescriptions on File Prior to Visit  Medication Sig Dispense Refill  . amLODipine (NORVASC) 5 MG tablet TAKE 1 TABLET (5 MG TOTAL) BY MOUTH DAILY. 90 tablet 0  . hydrochlorothiazide (HYDRODIURIL) 25 MG tablet TAKE 1 TABLET (25 MG TOTAL) BY MOUTH DAILY. 90 tablet 1  . Omega-3 Fatty Acids (FISH OIL) 1000 MG CAPS Take  2,000 mg by mouth 2 (two) times daily. Reported on 11/20/2015    . omeprazole (PRILOSEC) 40 MG capsule TAKE 1 CAPSULE (40 MG TOTAL) BY MOUTH DAILY. 90 capsule 1  . potassium chloride SA (KLOR-CON M20) 20 MEQ tablet TAKE 1 TABLET (20 MEQ TOTAL) BY MOUTH DAILY. 90 tablet 1   No current facility-administered medications on file prior to visit.    BP 135/82 mmHg  Pulse 66  Temp(Src) 97.6 F (36.4 C) (Oral)  Resp 18  Ht 5' 9.5" (1.765 m)  Wt 190 lb (86.183 kg)  BMI 27.67 kg/m2  SpO2 99%       Objective:   Physical Exam  Constitutional: He is oriented to person, place, and time. He appears well-developed and well-nourished. No distress.  HENT:  Head: Normocephalic and atraumatic.  Cardiovascular: Normal rate and regular rhythm.   No murmur heard. Pulmonary/Chest: Effort normal and breath sounds normal. No respiratory distress. He has no wheezes. He has no rales.  Musculoskeletal: He exhibits no edema.  Neurological: He is alert  and oriented to person, place, and time.  Sensation intact to monofilament bilateral feet including great toes.   Skin: Skin is warm and dry.  Psychiatric: He has a normal mood and affect. His behavior is normal. Thought content normal.          Assessment & Plan:  Bilateral great toe numbness.  Advised pt to begin short course of meloxicam. If symptoms worsen or if symptoms do not improve, consider further evaluation of the lumbar spine.

## 2016-01-04 NOTE — Assessment & Plan Note (Signed)
Stable on ppi, continue same.  

## 2016-01-04 NOTE — Patient Instructions (Addendum)
Please complete lab work prior to leaving.  Please work on avoiding concentrated sweets (soda/sweet tea) and weight loss. Start meloxicam (anti-inflammatory) once daily for your toe numbness. Call if you develop back pain, worsening numbness of big toes.

## 2016-01-04 NOTE — Assessment & Plan Note (Signed)
Discussed adding fish oil and working on avoiding concentrated sweets, exercise, weight loss.

## 2016-06-21 ENCOUNTER — Telehealth: Payer: Self-pay | Admitting: Family

## 2016-06-21 DIAGNOSIS — Z0000 Encounter for general adult medical examination without abnormal findings: Secondary | ICD-10-CM

## 2016-06-21 NOTE — Telephone Encounter (Signed)
HCTZ refill sent to pharmacy. Pt is due for fasting CPE on 07/06/16.  Please call pt to schedule appt. Thanks!

## 2016-06-22 NOTE — Telephone Encounter (Signed)
Called pt to scheduled F/U. Pt says that he will call us back to schedule. Pt says that he's not sure of his schedule.

## 2016-07-04 ENCOUNTER — Other Ambulatory Visit: Payer: Self-pay | Admitting: Family

## 2016-07-04 NOTE — Telephone Encounter (Signed)
Medication filled to pharmacy as requested.   

## 2016-10-17 ENCOUNTER — Other Ambulatory Visit: Payer: Self-pay | Admitting: Family

## 2016-10-17 DIAGNOSIS — Z0000 Encounter for general adult medical examination without abnormal findings: Secondary | ICD-10-CM

## 2016-10-17 NOTE — Telephone Encounter (Signed)
2 week supply of Potassium and HCTZ sent to pharmacy. Pt was due for physical in November and is past due. Please call pt to schedule this appointment before further refills can be sent. Thanks!

## 2016-10-20 NOTE — Telephone Encounter (Signed)
Pt's CPE has been scheduled.

## 2016-11-02 ENCOUNTER — Encounter: Payer: Self-pay | Admitting: Family

## 2016-11-02 ENCOUNTER — Ambulatory Visit (INDEPENDENT_AMBULATORY_CARE_PROVIDER_SITE_OTHER): Payer: Managed Care, Other (non HMO) | Admitting: Family

## 2016-11-02 VITALS — BP 139/75 | HR 72 | Temp 98.3°F | Resp 16 | Ht 70.0 in | Wt 193.6 lb

## 2016-11-02 DIAGNOSIS — R200 Anesthesia of skin: Secondary | ICD-10-CM

## 2016-11-02 DIAGNOSIS — R739 Hyperglycemia, unspecified: Secondary | ICD-10-CM

## 2016-11-02 DIAGNOSIS — Z0000 Encounter for general adult medical examination without abnormal findings: Secondary | ICD-10-CM | POA: Diagnosis not present

## 2016-11-02 LAB — HEPATIC FUNCTION PANEL
ALBUMIN: 4.5 g/dL (ref 3.5–5.2)
ALK PHOS: 98 U/L (ref 39–117)
ALT: 86 U/L — ABNORMAL HIGH (ref 0–53)
AST: 40 U/L — ABNORMAL HIGH (ref 0–37)
Bilirubin, Direct: 0.1 mg/dL (ref 0.0–0.3)
Total Bilirubin: 0.4 mg/dL (ref 0.2–1.2)
Total Protein: 7.9 g/dL (ref 6.0–8.3)

## 2016-11-02 LAB — URINALYSIS, ROUTINE W REFLEX MICROSCOPIC
Bilirubin Urine: NEGATIVE
Hgb urine dipstick: NEGATIVE
Ketones, ur: NEGATIVE
LEUKOCYTES UA: NEGATIVE
Nitrite: NEGATIVE
PH: 6 (ref 5.0–8.0)
RBC / HPF: NONE SEEN (ref 0–?)
SPECIFIC GRAVITY, URINE: 1.01 (ref 1.000–1.030)
TOTAL PROTEIN, URINE-UPE24: NEGATIVE
Urine Glucose: >=1000 — AB
Urobilinogen, UA: 0.2 (ref 0.0–1.0)
WBC, UA: NONE SEEN (ref 0–?)

## 2016-11-02 LAB — VITAMIN B12: VITAMIN B 12: 340 pg/mL (ref 211–911)

## 2016-11-02 LAB — FOLATE: FOLATE: 16.5 ng/mL (ref >5.9)

## 2016-11-02 LAB — CBC WITH DIFFERENTIAL/PLATELET
BASOS ABS: 0 10*3/uL (ref 0.0–0.1)
Basophils Relative: 0.3 % (ref 0.0–3.0)
EOS ABS: 0.2 10*3/uL (ref 0.0–0.7)
Eosinophils Relative: 1.9 % (ref 0.0–5.0)
HCT: 45.3 % (ref 39.0–52.0)
Hemoglobin: 15.9 g/dL (ref 13.0–17.0)
LYMPHS ABS: 3 10*3/uL (ref 0.7–4.0)
Lymphocytes Relative: 32.9 % (ref 12.0–46.0)
MCHC: 35.1 g/dL (ref 30.0–36.0)
MCV: 82 fl (ref 78.0–100.0)
MONOS PCT: 4.9 % (ref 3.0–12.0)
Monocytes Absolute: 0.4 10*3/uL (ref 0.1–1.0)
NEUTROS ABS: 5.4 10*3/uL (ref 1.4–7.7)
NEUTROS PCT: 60 % (ref 43.0–77.0)
PLATELETS: 282 10*3/uL (ref 150.0–400.0)
RBC: 5.53 Mil/uL (ref 4.22–5.81)
RDW: 13.3 % (ref 11.5–15.5)
WBC: 9 10*3/uL (ref 4.0–10.5)

## 2016-11-02 LAB — HEMOGLOBIN A1C: HEMOGLOBIN A1C: 8.3 % — AB (ref 4.6–6.5)

## 2016-11-02 LAB — BASIC METABOLIC PANEL
BUN: 11 mg/dL (ref 6–23)
CHLORIDE: 96 meq/L (ref 96–112)
CO2: 28 mEq/L (ref 19–32)
Calcium: 9.7 mg/dL (ref 8.4–10.5)
Creatinine, Ser: 1.11 mg/dL (ref 0.40–1.50)
GFR: 79.17 mL/min (ref >60.00)
Glucose, Bld: 290 mg/dL — ABNORMAL HIGH (ref 70–99)
POTASSIUM: 3.2 meq/L — AB (ref 3.5–5.1)
Sodium: 133 mEq/L — ABNORMAL LOW (ref 135–145)

## 2016-11-02 LAB — LIPID PANEL
CHOL/HDL RATIO: 5
CHOLESTEROL: 197 mg/dL (ref 0–200)
HDL: 37.6 mg/dL — AB (ref >39.00)

## 2016-11-02 LAB — TSH: TSH: 3.4 u[IU]/mL (ref 0.35–4.50)

## 2016-11-02 LAB — LDL CHOLESTEROL, DIRECT: LDL DIRECT: 92 mg/dL

## 2016-11-02 NOTE — Patient Instructions (Signed)
Please continue to work on healthy diet, exercise and weight loss.  ° °

## 2016-11-02 NOTE — Progress Notes (Signed)
Pre visit review using our clinic review tool, if applicable. No additional management support is needed unless otherwise documented below in the visit note. 

## 2016-11-02 NOTE — Progress Notes (Signed)
Subjective:    Patient ID: Mario Ibarra, male    DOB: [birth year], 36 y.o.   MRN: [id number]  HPI  Mario Ibarra is a 36 yr old male who presents today for cpx.  Immunizations: declines flu, tetanus 2010 Diet: reports that he has made some positive changes.  Wt Readings from Last 3 Encounters:  11/02/16 193 lb 9.6 oz (87.8 kg)  01/04/16 190 lb (86.2 kg)  11/20/15 187 lb 8 oz (85 kg)  Exercise:  Some exercise, not enough Vision:  Has appointment Dental:  Up to date.   Lab Results  Component Value Date   HGBA1C 6.2 07/06/2015     Review of Systems  Constitutional: Negative for unexpected weight change.  HENT: Negative for hearing loss and rhinorrhea.   Eyes: Negative for visual disturbance.  Respiratory: Negative for cough and shortness of breath.   Cardiovascular: Negative for chest pain and leg swelling.  Gastrointestinal: Negative for constipation, diarrhea and nausea.  Genitourinary: Negative for dysuria, frequency and hematuria.  Musculoskeletal: Negative for arthralgias and myalgias.       Soles of feet feel "tight"  Neurological:       Notes that he loses sensation in toes.    Had mild numbness in the left thumb for a few days.   Denies neck pain.  No neck pain, no arm pain.     Past Medical History:  Diagnosis Date  . Chicken pox   . Heartburn   . History of chicken pox   . Hypertension      Social History   Social History  . Marital status: Married    Spouse name: N/A  . Number of children: 1  . Years of education: N/A   Occupational History  . 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 Smoking status: Never Smoker  . Smokeless tobacco: Never Used  . Alcohol use 0.0 - 0.6 oz/week  . Drug use: No  . Sexual activity: Yes    Birth control/ protection: None   Other Topics Concern  . Not on file   Social History Narrative   Married to [place]. 2 children [place] and [place].   Some college. Owner of an  Statistician.   Drinks caffeinated beverages. Takes a daily vitamin.   Wears his seatbelt, smoke detector in the home.    Regular exercise: no       Past Surgical History:  Procedure Laterality Date  . KNEE SURGERY  age 7   bone spur? left knee    Family History  Problem Relation Age of Onset  . Diabetes Maternal Grandmother   . Heart disease Maternal Grandmother     CABG x 4  . Diabetes Paternal Grandfather   . Kidney disease Paternal Grandfather     kidney failure due to diabetes?  . Sleep apnea Father   . Diabetes Maternal Aunt   . Heart disease Maternal Uncle   . Cancer Paternal Uncle   . Diabetes Paternal Uncle   . Arrhythmia Maternal Grandfather      BP 139/75   Pulse 72   Temp 98.3 F (36.8 C) (Oral)   Resp 16   Ht 5\' 10"  (1.778 m)   Wt 193 lb 9.6 oz (87.8 kg)   SpO2 98% Comment: room air  BMI 27.78 kg/m       Objective:   Physical Exam  Physical Exam  Constitutional: He is oriented  to person, place, and time. He appears well-developed and well-nourished. No distress.  HENT:  Head: Normocephalic and atraumatic.  Right Ear: Tympanic membrane and ear canal normal.  Left Ear: Tympanic membrane and ear canal normal.  Mouth/Throat: Oropharynx is clear and moist.  Eyes: Pupils are equal, round, and reactive to light. No scleral icterus.  Neck: Normal range of motion. No thyromegaly present.  Cardiovascular: Normal rate and regular rhythm.   No murmur heard. Pulmonary/Chest: Effort normal and breath sounds normal. No respiratory distress. He has no wheezes. He has no rales. He exhibits no tenderness.  Abdominal: Soft. Bowel sounds are normal. He exhibits no distension and no mass. There is no tenderness. There is no rebound and no guarding.  Musculoskeletal: He exhibits no edema.  Lymphadenopathy:    He has no cervical adenopathy.  Neurological: He is alert and oriented to person, place, and time. He has normal patellar reflexes. He exhibits normal  muscle tone. Coordination normal.  Skin: Skin is warm and dry.  Psychiatric: He has a normal mood and affect. His behavior is normal. Judgment and thought content normal.           Assessment & Plan:          Assessment & Plan:  Numbness- mild, obtain b12 and folate levels.

## 2016-11-04 ENCOUNTER — Other Ambulatory Visit: Payer: Self-pay | Admitting: Family

## 2016-11-04 ENCOUNTER — Encounter: Payer: Self-pay | Admitting: Family

## 2016-11-04 DIAGNOSIS — Z0000 Encounter for general adult medical examination without abnormal findings: Secondary | ICD-10-CM

## 2016-11-04 DIAGNOSIS — E876 Hypokalemia: Secondary | ICD-10-CM

## 2016-11-04 MED ORDER — ASPIRIN EC 81 MG PO TBEC: 81.0000 mg | DELAYED_RELEASE_TABLET | Freq: Every day | ORAL | Status: AC

## 2016-11-04 MED ORDER — POTASSIUM CHLORIDE CRYS ER 20 MEQ PO TBCR: EXTENDED_RELEASE_TABLET | ORAL | 0 refills | Status: DC

## 2016-11-04 MED ORDER — ATORVASTATIN CALCIUM 10 MG PO TABS: 10.0000 mg | ORAL_TABLET | Freq: Every day | ORAL | 5 refills | Status: DC

## 2016-11-04 MED ORDER — LISINOPRIL 10 MG PO TABS: 10.0000 mg | ORAL_TABLET | Freq: Every day | ORAL | 2 refills | Status: DC

## 2016-11-04 MED ORDER — METFORMIN HCL 500 MG PO TABS: 500.0000 mg | ORAL_TABLET | Freq: Two times a day (BID) | ORAL | 5 refills | Status: DC

## 2016-11-04 NOTE — Telephone Encounter (Signed)
Also potassium is low.  I want him to stop hctz. Instead begin lisinopril 10mg  once daily.  Take 2 tabs of kdur today, then stop kdur.  Follow up in 1 week for nurse visit BP check and follow up BMET to recheck K+ (dx hypokalemia).

## 2016-11-04 NOTE — Telephone Encounter (Signed)
Should read- follow up in 3 months, not 5.

## 2016-11-04 NOTE — Telephone Encounter (Signed)
Notified pt and he voices understanding. Lab/nurse appt scheduled for 11/10/16 at 9am, future order entered. 3 month f/u scheduled for 02/07/17 at 9:30am. Prescriptions sent to pharmacy.

## 2016-11-04 NOTE — Telephone Encounter (Signed)
Lab work is showing that he has diabetes. A1C is 8.3 (was 6.2 1 year ago). I would recommend that he begin metformin and continue to work healthy diet, exercise, weight loss and avoiding concentrated sweets/limiting carbs. This will also help his triglycerides which are elevated.  I recommend that he add aspirin 81mg  once daily and atorvastatin 10mg  once daily to help decrease cardiac risk due to diabetes.  Follow up in 5 months.

## 2016-11-04 NOTE — Assessment & Plan Note (Signed)
Discussed healthy diet, exercise, weight loss.  Obtain routine lab work. Tetanus up to date.

## 2016-11-10 ENCOUNTER — Ambulatory Visit (INDEPENDENT_AMBULATORY_CARE_PROVIDER_SITE_OTHER): Payer: Managed Care, Other (non HMO) | Admitting: Medical

## 2016-11-10 VITALS — BP 113/64 | HR 67

## 2016-11-10 DIAGNOSIS — I10 Essential (primary) hypertension: Secondary | ICD-10-CM | POA: Diagnosis not present

## 2016-11-10 NOTE — Progress Notes (Signed)
Pre visit review using our clinic tool,if applicable. No additional management support is needed unless otherwise documented below in the visit note.   Patient in for BP check and BMET as ordered by M. O'Sullivan on 11/04/16.   BP=113/64 P=67  Patient states he did take BP medication this am. Per Marisue BrooklynEdward Saguier,PA-C patient will need to get BP cuff to monitor BP at home. Would like for patient to record BP's x 1 week and call in with readings. Patient advised.  Patient states he will call back for appointment to have labs drawn , he is unable to have this done today.    Pt recently taken of off of hctz and on lisinopril. LPN did not inform me pt did not take medication this am. I talked with pt directly and explained why it was important to check his bp daily. Record reading and notify us in one week on bp readings. We may need to advise using 5 mg of lisinopril q day as opposed to current 10 mg daily dose( if bp readings are on lower than todays reading). Pt agreed he would my chart his reading to us in one week. Pt reported no symptoms such as light headed or dizziness. Explained to pt cost of bp cuff otc and benefit of having this available to check his bp.  Saguier, Ramon DredgeEdward, PA-C

## 2016-11-10 NOTE — Patient Instructions (Signed)
Per Marisue BrooklynEdward Saguier,PA-C patient will need to get BP cuff to monitor BP at home. Would like for patient to record BP's x 1 week and call in with readings.    Patient to call to schedule Lab appointment because he is unable to stay today to have done.

## 2016-12-07 ENCOUNTER — Telehealth: Payer: Self-pay | Admitting: Family

## 2016-12-07 NOTE — Telephone Encounter (Signed)
BP readings look good. Continue current meds at current doses.

## 2016-12-07 NOTE — Telephone Encounter (Signed)
Caller name: Mario Ibarra Relationship to patient: self Can be reached: [phone]  Reason for call: BP readings 4/11 12:14pm 112/78 4/10 2:48pm 122/76 4/9 12:45pm 122/82 4/6 10:56am 114/79 4/5 11:23am 115/71 4/4 12:18pm 112/73 4/3 2:59pm 112/70 4/2 10:37am 124/92 3/30 11:15am 108/74 3/27 12:28pm 109/71 3/26 12:58pm 115/75 3/23 10:45am 115/77 3/22 11:39am 117/71 3/21 2:38pm 120/74 3/20 11:18am 117/82 3/19 11:21am 119/80 3/19 10:10am 115/85 3/18 6:29pm 127/77 3/17 10:57am 125/75

## 2016-12-08 NOTE — Telephone Encounter (Signed)
Spoke w/ Pt, informed of recommendations. Pt verbalized understanding.  

## 2017-01-16 ENCOUNTER — Other Ambulatory Visit: Payer: Self-pay | Admitting: Family Medicine

## 2017-01-17 NOTE — Telephone Encounter (Signed)
Received fax and eRx request for amlodipine 5mg  from [facility]. Amlodipine is no longer on current med list. Last OV note indicated pt should be taking lisinopril 10mg  once a day but that is not on current med list either. Attempted to reach pt to verify what he is currently taking for BP and left message to check mychart acct. Message sent.

## 2017-01-18 NOTE — Telephone Encounter (Signed)
Attempted to reach pt and left message to check mychart acct or call us with response.

## 2017-01-20 NOTE — Telephone Encounter (Signed)
Pt has not responded to voicemail or [employer]chart message. Denial sent to pharmacy.

## 2017-01-30 ENCOUNTER — Telehealth: Payer: Self-pay | Admitting: *Deleted

## 2017-01-30 MED ORDER — LISINOPRIL 10 MG PO TABS: 10.0000 mg | ORAL_TABLET | Freq: Every day | ORAL | 1 refills | Status: DC

## 2017-01-30 NOTE — Telephone Encounter (Signed)
Faxed refill request received from [facility] for Lisinopril 10 mg tab Last filled by MD on 11/04/16, Last AEX - 11/02/16 [OV w/[name] 11/10/16 post nurse visit for BP check, where Amlodipine 5 mg [D/C 11/10/16: patient reports not taking] & Klor-Con 20 meq, HCTZ 25 mg and Lisinopril 10 mg were all [D/C by Clydie BraunKaren, LPN with Reason: error]  Ref[facility]  [facility]16[name]  [name], [name], MD  Family Medicine   Medication Refill  Reason for call   Conversation: Medication Refill  (Newest Message First)  Jan 20, 2017  [name], [name], [name]  1:14 PM  Note    Pt has not responded to voicemail or [employer]mychart message. Denial sent to pharmacy.    Jan 18, 2017    6:07 PM  [name], [name], [name] routed this conversation to [name], [name], [name]  [name], [name], [name]  6:06 PM  Note    Attempted to reach pt and left message to check mychart acct or call us with response.    Jan 17, 2017  9:01 AM  [name], [name], [name] routed this conversation to [name], [name], [name]  [name], [name], [name]  to [name]  9:01 AM  We received a refill request from [facility] for amlodipine 5mg  and I wanted to verify what medication(s) you are currently taking for your blood pressure and what are the strength and directions?    This MyChart message has not been read.  [name], [name], [name]  8:58 AM Jan 16, 2017  11:54 AM  Note    Received fax and eRx request for amlodipine 5mg  from [facility]. Amlodipine is no longer on current med list. Last OV note indicated pt should be taking lisinopril 10mg  once a day but that is not on current med list either. Attempted to reach pt to verify what he is currently taking for BP and left message to check mychart acct. Message sent.     Next AEX - Patient has upcoming appt scheduled for 02/08/07 and currently has No BP medication on active med list in EMR; Please Advise on refills/SLS 06/04

## 2017-02-07 ENCOUNTER — Ambulatory Visit (INDEPENDENT_AMBULATORY_CARE_PROVIDER_SITE_OTHER): Payer: Managed Care, Other (non HMO) | Admitting: Family

## 2017-02-07 ENCOUNTER — Encounter: Payer: Self-pay | Admitting: Family

## 2017-02-07 VITALS — BP 133/80 | HR 61 | Temp 98.1°F | Resp 16 | Ht 70.0 in | Wt 186.8 lb

## 2017-02-07 DIAGNOSIS — K219 Gastro-esophageal reflux disease without esophagitis: Secondary | ICD-10-CM

## 2017-02-07 DIAGNOSIS — E785 Hyperlipidemia, unspecified: Secondary | ICD-10-CM | POA: Diagnosis not present

## 2017-02-07 DIAGNOSIS — E1165 Type 2 diabetes mellitus with hyperglycemia: Secondary | ICD-10-CM | POA: Diagnosis not present

## 2017-02-07 DIAGNOSIS — Z0000 Encounter for general adult medical examination without abnormal findings: Secondary | ICD-10-CM | POA: Diagnosis not present

## 2017-02-07 DIAGNOSIS — I10 Essential (primary) hypertension: Secondary | ICD-10-CM | POA: Diagnosis not present

## 2017-02-07 DIAGNOSIS — Z23 Encounter for immunization: Secondary | ICD-10-CM | POA: Diagnosis not present

## 2017-02-07 DIAGNOSIS — IMO0001 Reserved for inherently not codable concepts without codable children: Secondary | ICD-10-CM

## 2017-02-07 MED ORDER — OMEPRAZOLE 40 MG PO CPDR: DELAYED_RELEASE_CAPSULE | ORAL | 1 refills | Status: DC

## 2017-02-07 NOTE — Assessment & Plan Note (Signed)
Stable on PPI, continue same.  

## 2017-02-07 NOTE — Addendum Note (Signed)
Addended by: [name], Javonn Gauger A on: 02/07/2017 10:27 AM   Modules accepted: Orders

## 2017-02-07 NOTE — Assessment & Plan Note (Signed)
BP stable, continue ACE.  

## 2017-02-07 NOTE — Assessment & Plan Note (Signed)
Tolerating metformin. Reinforced importance of diet/exercise and weight loss. Obtain follow up A1C.

## 2017-02-07 NOTE — Progress Notes (Signed)
Subjective:    Patient ID: Mario Ibarra, male    DOB: [birth year], 36 y.o.   MRN: [id number]  HPI  Mr. Busam is a 36 yr old male who presents today for follow up.  1) HTN- he is currently maintained on lisinopril 10mg .  BP Readings from Last 3 Encounters:  02/07/17 133/80  11/10/16 113/64  11/02/16 139/75   2) DM2- metformin was added last visit.   Lab Results  Component Value Date   HGBA1C 8.3 (H) 11/02/2016   HGBA1C 6.2 07/06/2015   HGBA1C 5.7 (H) 12/24/2010   Lab Results  Component Value Date   LDLCALC 125 (H) 12/24/2010   CREATININE 1.11 11/02/2016    3) Hyperlipidemia- started on atorvastatin last visit.  Tolerating statin without myopathy  4) GERD- maintained on omeprazole. Reports symptoms are stable.   Review of Systems    see HPI  Past Medical History:  Diagnosis Date  . Chicken pox   . Diabetes type 2, uncontrolled (HCC) 01/27/2014  . Heartburn   . History of chicken pox   . Hypertension      Social History   Social History  . Marital status: Married    Spouse name: N/A  . Number of children: 1  . Years of education: N/A   Occupational History  . 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 Smoking status: Never Smoker  . Smokeless tobacco: Never Used  . Alcohol use 0.0 - 0.6 oz/week  . Drug use: No  . Sexual activity: Yes    Birth control/ protection: None   Other Topics Concern  . Not on file   Social History Narrative   Married to [place]. 2 children [place] and [place].   Some college. 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   Drinks caffeinated beverages. Takes a daily vitamin.   Wears his seatbelt, smoke detector in the home.    Regular exercise: no       Past Surgical History:  Procedure Laterality Date  . KNEE SURGERY  age 6   bone spur? left knee    Family History  Problem Relation Age of Onset  . Diabetes Maternal Grandmother   . Heart disease Maternal Grandmother        CABG x 4  .  Diabetes Paternal Grandfather   . Kidney disease Paternal Grandfather        kidney failure due to diabetes?  . Sleep apnea Father   . Diabetes Maternal Aunt   . Heart disease Maternal Uncle   . Cancer Paternal Uncle   . Diabetes Paternal Uncle   . Arrhythmia Maternal Grandfather     No Known Allergies  Current Outpatient Prescriptions on File Prior to Visit  Medication Sig Dispense Refill  . aspirin EC 81 MG tablet Take 1 tablet (81 mg total) by mouth daily.    Marland Kitchen atorvastatin (LIPITOR) 10 MG tablet Take 1 tablet (10 mg total) by mouth daily. 30 tablet 5  . lisinopril (PRINIVIL,ZESTRIL) 10 MG tablet Take 1 tablet (10 mg total) by mouth daily. 90 tablet 1  . metFORMIN (GLUCOPHAGE) 500 MG tablet Take 1 tablet (500 mg total) by mouth 2 (two) times daily with a meal. 60 tablet 5  . Omega-3 Fatty Acids (FISH OIL) 1000 MG CAPS Take 2,000 mg by mouth 2 (two) times daily. Reported on 11/20/2015    . omeprazole (PRILOSEC) 40 MG capsule TAKE 1 CAPSULE (40 MG TOTAL)  BY MOUTH DAILY. 90 capsule 1   No current facility-administered medications on file prior to visit.     BP 133/80 (BP Location: Left Arm, Cuff Size: Normal)   Pulse 61   Temp 98.1 F (36.7 C) (Oral)   Resp 16   Ht 5\' 10"  (1.778 m)   Wt 186 lb 12.8 oz (84.7 kg)   SpO2 98%   BMI 26.80 kg/m    Objective:   Physical Exam  Constitutional: He is oriented to person, place, and time. He appears well-developed and well-nourished. No distress.  HENT:  Head: Normocephalic and atraumatic.  Cardiovascular: Normal rate and regular rhythm.   No murmur heard. Pulmonary/Chest: Effort normal and breath sounds normal. No respiratory distress. He has no wheezes. He has no rales.  Musculoskeletal: He exhibits no edema.  Neurological: He is alert and oriented to person, place, and time.  Skin: Skin is warm and dry.  Psychiatric: He has a normal mood and affect. His behavior is normal. Thought content normal.          Assessment &  Plan:  Pneumovax today.

## 2017-02-07 NOTE — Assessment & Plan Note (Signed)
Tolerating statin, obtain flp 

## 2017-02-07 NOTE — Patient Instructions (Addendum)
Please complete lab work prior to leaving. Continue to work on healthy diet, exercise and weight loss.  

## 2017-05-03 ENCOUNTER — Other Ambulatory Visit: Payer: Self-pay | Admitting: Family

## 2017-05-17 ENCOUNTER — Other Ambulatory Visit: Payer: Self-pay | Admitting: Family

## 2017-08-08 ENCOUNTER — Other Ambulatory Visit: Payer: Self-pay | Admitting: Family

## 2017-08-25 ENCOUNTER — Other Ambulatory Visit: Payer: Self-pay | Admitting: Family

## 2017-11-08 ENCOUNTER — Other Ambulatory Visit: Payer: Self-pay | Admitting: Family

## 2017-11-12 ENCOUNTER — Other Ambulatory Visit: Payer: Self-pay | Admitting: Family

## 2017-11-13 NOTE — Telephone Encounter (Signed)
2 week supply of metformin and atorvastatin sent to pharmacy. Pt needs office visit with Melissa soon. Will need to be seen for further refills. Thanks!

## 2017-11-13 NOTE — Telephone Encounter (Signed)
Called pt and LVM advising pt to call and schedule an appt for any further refills

## 2017-12-06 ENCOUNTER — Other Ambulatory Visit: Payer: Self-pay | Admitting: Family

## 2017-12-06 NOTE — Telephone Encounter (Signed)
2 week supply of lisinopril sent to pharmacy with note to schedule appt for greater quantities / refills. Messages have previously been sent to pt that he is past due for appt but he has not scheduled appt yet. Further refills will be denied and pt needs to be seen as soon as possible. Pt was sent an email on 08/10/17 that he was due for appt and message was read by pt.

## 2017-12-11 ENCOUNTER — Other Ambulatory Visit: Payer: Self-pay | Admitting: Family

## 2017-12-11 MED ORDER — METFORMIN HCL 500 MG PO TABS: 500.0000 mg | ORAL_TABLET | Freq: Two times a day (BID) | ORAL | 0 refills | Status: DC

## 2017-12-11 MED ORDER — ATORVASTATIN CALCIUM 10 MG PO TABS: 10.0000 mg | ORAL_TABLET | Freq: Every day | ORAL | 0 refills | Status: DC

## 2017-12-19 ENCOUNTER — Telehealth: Payer: Self-pay | Admitting: Family

## 2017-12-19 ENCOUNTER — Encounter: Payer: Self-pay | Admitting: Family

## 2017-12-19 ENCOUNTER — Ambulatory Visit: Payer: Managed Care, Other (non HMO) | Admitting: Family

## 2017-12-19 VITALS — BP 137/80 | HR 80 | Temp 97.6°F | Resp 16 | Ht 70.0 in | Wt 189.6 lb

## 2017-12-19 DIAGNOSIS — E785 Hyperlipidemia, unspecified: Secondary | ICD-10-CM | POA: Diagnosis not present

## 2017-12-19 DIAGNOSIS — E1165 Type 2 diabetes mellitus with hyperglycemia: Secondary | ICD-10-CM

## 2017-12-19 DIAGNOSIS — I10 Essential (primary) hypertension: Secondary | ICD-10-CM

## 2017-12-19 LAB — HEPATIC FUNCTION PANEL
ALT: 76 U/L — AB (ref 0–53)
AST: 33 U/L (ref 0–37)
Albumin: 4.5 g/dL (ref 3.5–5.2)
Alkaline Phosphatase: 109 U/L (ref 39–117)
BILIRUBIN DIRECT: 0.1 mg/dL (ref 0.0–0.3)
BILIRUBIN TOTAL: 0.4 mg/dL (ref 0.2–1.2)
TOTAL PROTEIN: 7.6 g/dL (ref 6.0–8.3)

## 2017-12-19 LAB — LIPID PANEL
Cholesterol: 120 mg/dL (ref 0–200)
HDL: 37.3 mg/dL — ABNORMAL LOW (ref >39.00)
NONHDL: 83.08
Total CHOL/HDL Ratio: 3
Triglycerides: 213 mg/dL — ABNORMAL HIGH (ref 0.0–149.0)
VLDL: 42.6 mg/dL — ABNORMAL HIGH (ref 0.0–40.0)

## 2017-12-19 LAB — GLUCOSE, POCT (MANUAL RESULT ENTRY): POC GLUCOSE: 209 mg/dL — AB (ref 70–99)

## 2017-12-19 LAB — BASIC METABOLIC PANEL
BUN: 12 mg/dL (ref 6–23)
CHLORIDE: 99 meq/L (ref 96–112)
CO2: 28 meq/L (ref 19–32)
Calcium: 9.5 mg/dL (ref 8.4–10.5)
Creatinine, Ser: 0.99 mg/dL (ref 0.40–1.50)
GFR: 89.8 mL/min (ref >60.00)
Glucose, Bld: 210 mg/dL — ABNORMAL HIGH (ref 70–99)
POTASSIUM: 4.8 meq/L (ref 3.5–5.1)
SODIUM: 133 meq/L — AB (ref 135–145)

## 2017-12-19 LAB — MICROALBUMIN / CREATININE URINE RATIO
Creatinine,U: 185.9 mg/dL
MICROALB UR: 2.3 mg/dL — AB (ref 0.0–1.9)
MICROALB/CREAT RATIO: 1.2 mg/g (ref 0.0–30.0)

## 2017-12-19 LAB — LDL CHOLESTEROL, DIRECT: Direct LDL: 65 mg/dL

## 2017-12-19 MED ORDER — ATORVASTATIN CALCIUM 10 MG PO TABS: 10.0000 mg | ORAL_TABLET | Freq: Every day | ORAL | 5 refills | Status: DC

## 2017-12-19 MED ORDER — LISINOPRIL 10 MG PO TABS: ORAL_TABLET | ORAL | 5 refills | Status: DC

## 2017-12-19 MED ORDER — METFORMIN HCL 500 MG PO TABS: 500.0000 mg | ORAL_TABLET | Freq: Two times a day (BID) | ORAL | 5 refills | Status: DC

## 2017-12-19 MED ORDER — OMEPRAZOLE 40 MG PO CPDR: DELAYED_RELEASE_CAPSULE | ORAL | 5 refills | Status: DC

## 2017-12-19 MED ORDER — GLUCOSE BLOOD VI STRP
ORAL_STRIP | 12 refills | Status: AC
Start: 2017-12-19 — End: ?

## 2017-12-19 MED ORDER — ACCU-CHEK MULTICLIX LANCETS MISC: 12 refills | Status: AC

## 2017-12-19 NOTE — Telephone Encounter (Signed)
Test cannot be added. Test requires lavender top tube and tests from earlier today did not require lavender top tube.

## 2017-12-19 NOTE — Telephone Encounter (Signed)
Could you please ask pt to return for A1c at his convenience, my apologies that it was not included today.

## 2017-12-19 NOTE — Addendum Note (Signed)
Addended by: Wilford CornerSIERRA, Duwayne Matters M on: 12/19/2017 11:04 AM   Modules accepted: Orders

## 2017-12-19 NOTE — Telephone Encounter (Signed)
Could you please see if lab can add on A1C to today's sample please? Dx DM2.

## 2017-12-19 NOTE — Progress Notes (Signed)
Subjective:    Patient ID: Mario Ibarra, male    DOB: July 08, 1981, 37 y.o.   MRN: [id number]  HPI  Patient is a 37 year old male who presents today for follow-up.  He was last seen in June 2018.  At that time his sugar was noted to be elevated in the diabetes range with an A1c of 8.3.  Previous A1c's have been hovering around 6. He was started on glucophage.   Wt Readings from Last 3 Encounters:  12/19/17 189 lb 9.6 oz (86 kg)  02/07/17 186 lb 12.8 oz (84.7 kg)  11/02/16 193 lb 9.6 oz (87.8 kg)    Lab Results  Component Value Date   HGBA1C 8.3 (H) 11/02/2016   HGBA1C 6.2 07/06/2015   HGBA1C 5.7 (H) 12/24/2010    Hyperlipidemia- continues lipitor, denies myalgia.   Lab Results  Component Value Date   LDLCALC 125 (H) 12/24/2010   CREATININE 1.11 11/02/2016   HTN- maintained on lisinopril 10mg .  BP Readings from Last 3 Encounters:  12/19/17 137/80  02/07/17 133/80  11/10/16 113/64  Review of Systems See HPI  Past Medical History:  Diagnosis Date  . Chicken pox   . Diabetes type 2, uncontrolled (HCC) 01/27/2014  . Heartburn   . History of chicken pox   . Hypertension      Social History   Socioeconomic History  . Marital status: Married    Spouse name: Not on file  . Number of children: 1  . Years of education: Not on file  . Highest education level: Not on file  Occupational History  . 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 Financial resource strain: Not on file  . Food insecurity:    Worry: Not on file    Inability: Not on file  . Transportation needs:    Medical: Not on file    Non-medical: Not on file  Tobacco Use  . Smoking status: Never Smoker  . Smokeless tobacco: Never Used  Substance and Sexual Activity  . Alcohol use: Yes    Alcohol/week: 0.0 - 0.6 oz  . Drug use: No  . Sexual activity: Yes    Birth control/protection: None  Lifestyle  . Physical activity:    Days  per week: Not on file    Minutes per session: Not on file  . Stress: Not on file  Relationships  . Social connections:    Talks on phone: Not on file    Gets together: Not on file    Attends religious service: Not on file    Active member of club or organization: Not on file    Attends meetings of clubs or organizations: Not on file    Relationship status: Not on file  . Intimate partner violence:    Fear of current or ex partner: Not on file    Emotionally abused: Not on file    Physically abused: Not on file    Forced sexual activity: Not on file  Other Topics Concern  . Not on file  Social History Narrative   Married to [place]. 2 children [place] and [place].   Some college. 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   Drinks caffeinated beverages. Takes a daily vitamin.   Wears his seatbelt, smoke detector in the home.    Regular exercise: no    Past Surgical History:  Procedure Laterality Date  . KNEE SURGERY  age 49  bone spur? left knee    Family History  Problem Relation Age of Onset  . Diabetes Maternal Grandmother   . Heart disease Maternal Grandmother        CABG x 4  . Diabetes Paternal Grandfather   . Kidney disease Paternal Grandfather        kidney failure due to diabetes?  . Diabetes Mellitus II Mother        "borderline"  . Sleep apnea Father   . Diabetes Maternal Aunt   . Heart disease Maternal Uncle   . Cancer Paternal Uncle   . Diabetes Paternal Uncle   . Arrhythmia Maternal Grandfather     No Known Allergies  Current Outpatient Medications on File Prior to Visit  Medication Sig Dispense Refill  . aspirin EC 81 MG tablet Take 1 tablet (81 mg total) by mouth daily.    Marland Kitchen atorvastatin (LIPITOR) 10 MG tablet Take 1 tablet (10 mg total) by mouth daily. 30 tablet 0  . lisinopril (PRINIVIL,ZESTRIL) 10 MG tablet TAKE 1 TABLET BY MOUTH EVERY DAY **NEEDS OV** 14 tablet 0  . metFORMIN (GLUCOPHAGE) 500 MG tablet Take 1 tablet (500 mg total) by mouth 2 (two) times daily  with a meal. 30 tablet 0  . Omega-3 Fatty Acids (FISH OIL) 1000 MG CAPS Take 2,000 mg by mouth 2 (two) times daily. Reported on 11/20/2015    . omeprazole (PRILOSEC) 40 MG capsule TAKE 1 CAPSULE BY MOUTH EVERY DAY 90 capsule 1   No current facility-administered medications on file prior to visit.     BP 137/80 (BP Location: Left Arm, Patient Position: Sitting, Cuff Size: Small)   Pulse 80   Temp 97.6 F (36.4 C) (Oral)   Resp 16   Ht 5\' 10"  (1.778 m)   Wt 189 lb 9.6 oz (86 kg)   SpO2 99%   BMI 27.20 kg/m      Objective:   Physical Exam  Constitutional: He is oriented to person, place, and time. He appears well-developed and well-nourished. No distress.  HENT:  Head: Normocephalic and atraumatic.  Cardiovascular: Normal rate and regular rhythm.  No murmur heard. Pulmonary/Chest: Effort normal and breath sounds normal. No respiratory distress. He has no wheezes. He has no rales.  Musculoskeletal: He exhibits no edema.  Neurological: He is alert and oriented to person, place, and time.  Skin: Skin is warm and dry.  Psychiatric: He has a normal mood and affect. His behavior is normal. Thought content normal.          Assessment & Plan:  Diabetes type 2- advised pt to check cbc once daily. Discussed glycemic goals. Continue metformin, diabetic diet and encourage exercise.  cma will provide glucometer teaching today.   HTN- BP stable, continue ACE.  Hyperlipidemia- continues statin, check follow up lipid panel.

## 2017-12-19 NOTE — Patient Instructions (Signed)
Please complete lab work prior to leaving.   

## 2017-12-19 NOTE — Addendum Note (Signed)
Addended by: Sandford Craze'SULLIVAN, Truxton Stupka on: 12/19/2017 09:48 AM   Modules accepted: Orders

## 2017-12-19 NOTE — Addendum Note (Signed)
Addended by: Wilford CornerSIERRA, Alleyah Twombly M on: 12/19/2017 01:27 PM   Modules accepted: Orders

## 2017-12-20 ENCOUNTER — Other Ambulatory Visit: Payer: Self-pay

## 2017-12-20 DIAGNOSIS — E1165 Type 2 diabetes mellitus with hyperglycemia: Secondary | ICD-10-CM

## 2017-12-20 NOTE — Telephone Encounter (Signed)
Called patient to scheduled the Hgb a1c bur he needs to look at his calendar to schedule. He will call back to set up, order entered.

## 2018-06-27 ENCOUNTER — Other Ambulatory Visit: Payer: Self-pay | Admitting: Family

## 2018-06-27 NOTE — Telephone Encounter (Signed)
30 day supply of Lisinopril sent to pharmacy. Pt past due for 3 month follow up. Mychart message sent to pt to schedule appt.

## 2018-07-28 ENCOUNTER — Other Ambulatory Visit: Payer: Self-pay | Admitting: Family

## 2018-08-05 ENCOUNTER — Other Ambulatory Visit: Payer: Self-pay | Admitting: Family

## 2018-08-06 NOTE — Telephone Encounter (Signed)
Atorvastatin request denied. Pt's last OV 12/19/17 and he was advised to return in 3 months. Pt is past due. Email and phone attempts to have pt schedule OV have been unsuccessful.

## 2018-08-10 ENCOUNTER — Other Ambulatory Visit: Payer: Self-pay | Admitting: Family

## 2018-08-17 ENCOUNTER — Encounter: Payer: Self-pay | Admitting: Family

## 2018-08-17 MED ORDER — OMEPRAZOLE 40 MG PO CPDR: DELAYED_RELEASE_CAPSULE | ORAL | 0 refills | Status: DC

## 2018-08-17 MED ORDER — METFORMIN HCL 500 MG PO TABS: 500.0000 mg | ORAL_TABLET | Freq: Two times a day (BID) | ORAL | 0 refills | Status: DC

## 2018-08-17 MED ORDER — ATORVASTATIN CALCIUM 10 MG PO TABS: 10.0000 mg | ORAL_TABLET | Freq: Every day | ORAL | 0 refills | Status: DC

## 2018-08-17 MED ORDER — LISINOPRIL 10 MG PO TABS: ORAL_TABLET | ORAL | 0 refills | Status: DC

## 2018-08-31 ENCOUNTER — Encounter: Payer: Self-pay | Admitting: *Deleted

## 2018-08-31 NOTE — Telephone Encounter (Signed)
Mychart message from 08/17/18 not read. Mailed letter to pt.

## 2018-09-13 ENCOUNTER — Other Ambulatory Visit: Payer: Self-pay | Admitting: Family

## 2018-09-14 ENCOUNTER — Ambulatory Visit: Payer: Managed Care, Other (non HMO) | Admitting: Family

## 2018-09-14 ENCOUNTER — Encounter: Payer: Self-pay | Admitting: Family

## 2018-09-14 VITALS — BP 128/81 | HR 60 | Temp 98.0°F | Resp 16 | Ht 70.0 in | Wt 186.0 lb

## 2018-09-14 DIAGNOSIS — E1165 Type 2 diabetes mellitus with hyperglycemia: Secondary | ICD-10-CM

## 2018-09-14 DIAGNOSIS — I10 Essential (primary) hypertension: Secondary | ICD-10-CM

## 2018-09-14 DIAGNOSIS — E785 Hyperlipidemia, unspecified: Secondary | ICD-10-CM | POA: Diagnosis not present

## 2018-09-14 DIAGNOSIS — K219 Gastro-esophageal reflux disease without esophagitis: Secondary | ICD-10-CM

## 2018-09-14 LAB — HEPATIC FUNCTION PANEL
ALT: 41 U/L (ref 0–53)
AST: 25 U/L (ref 0–37)
Albumin: 4.4 g/dL (ref 3.5–5.2)
Alkaline Phosphatase: 86 U/L (ref 39–117)
BILIRUBIN DIRECT: 0.1 mg/dL (ref 0.0–0.3)
BILIRUBIN TOTAL: 0.4 mg/dL (ref 0.2–1.2)
Total Protein: 7 g/dL (ref 6.0–8.3)

## 2018-09-14 LAB — BASIC METABOLIC PANEL
BUN: 16 mg/dL (ref 6–23)
CHLORIDE: 101 meq/L (ref 96–112)
CO2: 29 mEq/L (ref 19–32)
Calcium: 9.4 mg/dL (ref 8.4–10.5)
Creatinine, Ser: 1.05 mg/dL (ref 0.40–1.50)
GFR: 78.63 mL/min (ref >60.00)
Glucose, Bld: 167 mg/dL — ABNORMAL HIGH (ref 70–99)
Potassium: 4.4 mEq/L (ref 3.5–5.1)
Sodium: 138 mEq/L (ref 135–145)

## 2018-09-14 LAB — LDL CHOLESTEROL, DIRECT: Direct LDL: 55 mg/dL

## 2018-09-14 LAB — HEMOGLOBIN A1C: Hgb A1c MFr Bld: 6 % (ref 4.6–6.5)

## 2018-09-14 LAB — LIPID PANEL
Cholesterol: 114 mg/dL (ref 0–200)
HDL: 36 mg/dL — ABNORMAL LOW (ref >39.00)
NonHDL: 77.86
Total CHOL/HDL Ratio: 3
Triglycerides: 245 mg/dL — ABNORMAL HIGH (ref 0.0–149.0)
VLDL: 49 mg/dL — ABNORMAL HIGH (ref 0.0–40.0)

## 2018-09-14 MED ORDER — OMEPRAZOLE 40 MG PO CPDR: 40.0000 mg | DELAYED_RELEASE_CAPSULE | Freq: Every day | ORAL | 0 refills | Status: DC | PRN

## 2018-09-14 MED ORDER — METFORMIN HCL 500 MG PO TABS: 500.0000 mg | ORAL_TABLET | Freq: Two times a day (BID) | ORAL | 1 refills | Status: DC

## 2018-09-14 MED ORDER — LISINOPRIL 10 MG PO TABS: ORAL_TABLET | ORAL | 1 refills | Status: DC

## 2018-09-14 NOTE — Progress Notes (Signed)
Subjective:    Patient ID: Mario Ibarra, male    DOB: [birth year], 37 y.o.   MRN: [id number]  HPI   Patient is a 38 yr old male who presents today for follow up.  DM2- maintained on metformin. Reports that he stopped real soda completely.  Wt Readings from Last 3 Encounters:  09/14/18 186 lb (84.4 kg)  12/19/17 189 lb 9.6 oz (86 kg)  02/07/17 186 lb 12.8 oz (84.7 kg)     Lab Results  Component Value Date   HGBA1C 8.3 (H) 11/02/2016   HGBA1C 6.2 07/06/2015   HGBA1C 5.7 (H) 12/24/2010   Lab Results  Component Value Date   MICROALBUR 2.3 (H) 12/19/2017   LDLCALC 125 (H) 12/24/2010   CREATININE 0.99 12/19/2017   Hyperlipidemia- maintained on atorvastatin 10mg .   Lab Results  Component Value Date   CHOL 120 12/19/2017   HDL 37.30 (L) 12/19/2017   LDLCALC 125 (H) 12/24/2010   LDLDIRECT 65.0 12/19/2017   TRIG 213.0 (H) 12/19/2017   CHOLHDL 3 12/19/2017   GERD- maintained on omeprazole. Reports no recent symptoms. Only using omeprazole once a week "maybe."      Review of Systems See HPI  Past Medical History:  Diagnosis Date  . Chicken pox   . Diabetes type 2, uncontrolled (HCC) 01/27/2014  . Heartburn   . History of chicken pox   . Hypertension      Social History   Socioeconomic History  . Marital status: Married    Spouse name: Not on file  . Number of children: 1  . Years of education: Not on file  . Highest education level: Not on file  Occupational History  . 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 Financial resource strain: Not on file  . Food insecurity:    Worry: Not on file    Inability: Not on file  . Transportation needs:    Medical: Not on file    Non-medical: Not on file  Tobacco Use  . Smoking status: Never Smoker  . Smokeless tobacco: Never Used  Substance and Sexual Activity  . Alcohol use: Yes    Alcohol/week: 0.0 - 1.0 standard drinks  . Drug use: No  .  Sexual activity: Yes    Birth control/protection: None  Lifestyle  . Physical activity:    Days per week: Not on file    Minutes per session: Not on file  . Stress: Not on file  Relationships  . Social connections:    Talks on phone: Not on file    Gets together: Not on file    Attends religious service: Not on file    Active member of club or organization: Not on file    Attends meetings of clubs or organizations: Not on file    Relationship status: Not on file  . Intimate partner violence:    Fear of current or ex partner: Not on file    Emotionally abused: Not on file    Physically abused: Not on file    Forced sexual activity: Not on file  Other Topics Concern  . Not on file  Social History Narrative   Married to [place]. 2 children [place] and [place].   Some college. 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   Drinks caffeinated beverages. Takes a daily vitamin.   Wears his seatbelt, smoke detector in the home.    Regular exercise:  no    Past Surgical History:  Procedure Laterality Date  . KNEE SURGERY  age 13   bone spur? left knee    Family History  Problem Relation Age of Onset  . Diabetes Maternal Grandmother   . Heart disease Maternal Grandmother        CABG x 4  . Diabetes Paternal Grandfather   . Kidney disease Paternal Grandfather        kidney failure due to diabetes?  . Diabetes Mellitus II Mother        "borderline"  . Sleep apnea Father   . Diabetes Maternal Aunt   . Heart disease Maternal Uncle   . Cancer Paternal Uncle   . Diabetes Paternal Uncle   . Arrhythmia Maternal Grandfather     No Known Allergies  Current Outpatient Medications on File Prior to Visit  Medication Sig Dispense Refill  . aspirin EC 81 MG tablet Take 1 tablet (81 mg total) by mouth daily.    Marland Kitchen atorvastatin (LIPITOR) 10 MG tablet TAKE 1 TABLET BY MOUTH EVERY DAY 30 tablet 5  . glucose blood (ACCU-CHEK ACTIVE STRIPS) test strip Use as instructed 100 each 12  . Lancets (ACCU-CHEK  MULTICLIX) lancets Use as instructed 100 each 12  . lisinopril (PRINIVIL,ZESTRIL) 10 MG tablet Take 1 tablet by mouth daily. 30 tablet 0  . metFORMIN (GLUCOPHAGE) 500 MG tablet Take 1 tablet (500 mg total) by mouth 2 (two) times daily with a meal. 60 tablet 0  . Omega-3 Fatty Acids (FISH OIL) 1000 MG CAPS Take 2,000 mg by mouth 2 (two) times daily. Reported on 11/20/2015    . omeprazole (PRILOSEC) 40 MG capsule TAKE 1 CAPSULE BY MOUTH EVERY DAY 30 capsule 0   No current facility-administered medications on file prior to visit.     BP 128/81 (BP Location: Left Arm, Patient Position: Sitting, Cuff Size: Small)   Pulse 60   Temp 98 F (36.7 C) (Oral)   Resp 16   Ht 5\' 10"  (1.778 m)   Wt 186 lb (84.4 kg)   SpO2 100%   BMI 26.69 kg/m       Objective:   Physical Exam Constitutional:      General: He is not in acute distress.    Appearance: He is well-developed.  HENT:     Head: Normocephalic and atraumatic.  Cardiovascular:     Rate and Rhythm: Normal rate and regular rhythm.     Heart sounds: No murmur.  Pulmonary:     Effort: Pulmonary effort is normal. No respiratory distress.     Breath sounds: Normal breath sounds. No wheezing or rales.  Skin:    General: Skin is warm and dry.  Neurological:     Mental Status: He is alert and oriented to person, place, and time.  Psychiatric:        Behavior: Behavior normal.        Thought Content: Thought content normal.           Assessment & Plan:  HTN-  BP stable on lisinopril, continue same.  Hyperlipidemia- working on diet. Has lost some weight. Obtain follow up lipid panel. Continue statin, fish oil. Obtain follow up lipid panel.   GERD- stable/improved. Only using prilosec prn.   DM2- has improved diet. Continues metformin. Obtain follow up A1C/Bmet.

## 2018-09-14 NOTE — Patient Instructions (Signed)
Please complete lab work prior to leaving. Schedule routine diabetic eye exam.

## 2018-12-01 ENCOUNTER — Telehealth: Payer: Self-pay | Admitting: Family

## 2018-12-01 NOTE — Telephone Encounter (Signed)
Please contact pt and schedule him for a 3 month follow up virtual visit around 12/14/18.

## 2018-12-04 ENCOUNTER — Other Ambulatory Visit: Payer: Self-pay

## 2018-12-04 ENCOUNTER — Ambulatory Visit (INDEPENDENT_AMBULATORY_CARE_PROVIDER_SITE_OTHER): Payer: Managed Care, Other (non HMO) | Admitting: Family

## 2018-12-04 DIAGNOSIS — K219 Gastro-esophageal reflux disease without esophagitis: Secondary | ICD-10-CM

## 2018-12-04 DIAGNOSIS — E1165 Type 2 diabetes mellitus with hyperglycemia: Secondary | ICD-10-CM | POA: Diagnosis not present

## 2018-12-04 DIAGNOSIS — E785 Hyperlipidemia, unspecified: Secondary | ICD-10-CM | POA: Diagnosis not present

## 2018-12-04 MED ORDER — ATORVASTATIN CALCIUM 10 MG PO TABS: 10.0000 mg | ORAL_TABLET | Freq: Every day | ORAL | 1 refills | Status: DC

## 2018-12-04 MED ORDER — OMEPRAZOLE 40 MG PO CPDR: 40.0000 mg | DELAYED_RELEASE_CAPSULE | Freq: Every day | ORAL | 1 refills | Status: DC | PRN

## 2018-12-04 NOTE — Progress Notes (Signed)
Virtual Visit via Video Note  I connected with Mario Ibarra on 12/04/18 at  1:20 PM EDT by a video enabled telemedicine application and verified that I am speaking with the correct person using two identifiers.   I discussed the limitations of evaluation and management by telemedicine and the availability of in person appointments. The patient expressed understanding and agreed to proceed.  I was in my office and he patient was in his office at the time of today's visit.   History of Present Illness:  HTN- does take lisinopril once daily.  Not checking his blood pressure, does not have a bp cuff.  BP Readings from Last 3 Encounters:  09/14/18 128/81  12/19/17 137/80  02/07/17 133/80   Lab Results  Component Value Date   HGBA1C 6.0 09/14/2018   HGBA1C 8.3 (H) 11/02/2016   HGBA1C 6.2 07/06/2015   Lab Results  Component Value Date   MICROALBUR 2.3 (H) 12/19/2017   LDLCALC 125 (H) 12/24/2010   CREATININE 1.05 09/14/2018  Reports that he continues metformin bid.  Reports that he has eliminated sugary beverages from his diet and has cut back on his carbs.   Report that his weight today was 178.6   Wt Readings from Last 3 Encounters:  09/14/18 186 lb (84.4 kg)  12/19/17 189 lb 9.6 oz (86 kg)  02/07/17 186 lb 12.8 oz (84.7 kg)   Lab Results  Component Value Date   CHOL 114 09/14/2018   HDL 36.00 (L) 09/14/2018   LDLCALC 125 (H) 12/24/2010   LDLDIRECT 55.0 09/14/2018   TRIG 245.0 (H) 09/14/2018   CHOLHDL 3 09/14/2018   gerd- reports that he is using omeprazole just once every 3 days prn. This seems to be adequate given his recent dietary changes and weight loss.     Observations/Objective:  Gen: Awake, alert, no acute distress Resp: Breathing is even and non-labored Psych: calm/pleasant demeanor Neuro: Alert and Oriented x 3, + facial symmetry, speech is clear.   Assessment and Plan: HTN- bp is stable on current dose of lisinopril.  Hyperlipidemia- LDL is at goal.   Continue statin.  GERD- stable with PRN omeprazole only.   Follow Up Instructions:  Pt advised to schedule a lab visit in 4-6 week when Covid-19 outbreak hopefully improves.  Follow up for a face to face visit in 3 months    I discussed the assessment and treatment plan with the patient. The patient was provided an opportunity to ask questions and all were answered. The patient agreed with the plan and demonstrated an understanding of the instructions.   The patient was advised to call back or seek an in-person evaluation if the symptoms worsen or if the condition fails to improve as anticipated.   Lemont Fillers, NP

## 2018-12-04 NOTE — Telephone Encounter (Signed)
Patient scheduled for today at 1:20 Doxy-me virtual visit

## 2019-03-20 ENCOUNTER — Other Ambulatory Visit: Payer: Self-pay | Admitting: Family

## 2019-03-20 NOTE — Telephone Encounter (Signed)
Called pt stated he would look into his schedule and call back to make appt. Stated he just had a lot going on with other meetings

## 2019-03-20 NOTE — Telephone Encounter (Signed)
Pt due for follow up. OK to schedule virtually. Please contact pt to schedule.

## 2019-06-09 ENCOUNTER — Other Ambulatory Visit: Payer: Self-pay | Admitting: Family

## 2019-06-12 ENCOUNTER — Telehealth: Payer: Self-pay | Admitting: Family

## 2019-06-12 NOTE — Telephone Encounter (Signed)
Please contact pt to schedule a face to face visit with me. He is past due.

## 2019-06-13 ENCOUNTER — Other Ambulatory Visit: Payer: Self-pay

## 2019-06-18 ENCOUNTER — Telehealth: Payer: Self-pay | Admitting: Family

## 2019-06-19 ENCOUNTER — Ambulatory Visit: Payer: Managed Care, Other (non HMO) | Admitting: Family

## 2019-06-19 MED ORDER — METFORMIN HCL 500 MG PO TABS: 500.0000 mg | ORAL_TABLET | Freq: Two times a day (BID) | ORAL | 0 refills | Status: DC

## 2019-06-19 MED ORDER — ATORVASTATIN CALCIUM 10 MG PO TABS: 10.0000 mg | ORAL_TABLET | Freq: Every day | ORAL | 0 refills | Status: DC

## 2019-06-19 MED ORDER — LISINOPRIL 10 MG PO TABS: ORAL_TABLET | ORAL | 0 refills | Status: DC

## 2019-06-19 NOTE — Telephone Encounter (Signed)
Patient advised of message.

## 2019-06-19 NOTE — Telephone Encounter (Signed)
See my chart message

## 2019-06-28 ENCOUNTER — Ambulatory Visit: Payer: Managed Care, Other (non HMO) | Admitting: Family

## 2019-06-28 ENCOUNTER — Other Ambulatory Visit: Payer: Self-pay

## 2019-06-28 ENCOUNTER — Encounter: Payer: Self-pay | Admitting: Family

## 2019-06-28 VITALS — BP 115/55 | HR 70 | Temp 97.1°F | Resp 16 | Ht 69.3 in | Wt 188.0 lb

## 2019-06-28 DIAGNOSIS — E785 Hyperlipidemia, unspecified: Secondary | ICD-10-CM

## 2019-06-28 DIAGNOSIS — I10 Essential (primary) hypertension: Secondary | ICD-10-CM

## 2019-06-28 DIAGNOSIS — K219 Gastro-esophageal reflux disease without esophagitis: Secondary | ICD-10-CM

## 2019-06-28 DIAGNOSIS — E1165 Type 2 diabetes mellitus with hyperglycemia: Secondary | ICD-10-CM | POA: Diagnosis not present

## 2019-06-28 LAB — COMPREHENSIVE METABOLIC PANEL
ALT: 57 U/L — ABNORMAL HIGH (ref 0–53)
AST: 26 U/L (ref 0–37)
Albumin: 4.8 g/dL (ref 3.5–5.2)
Alkaline Phosphatase: 103 U/L (ref 39–117)
BUN: 14 mg/dL (ref 6–23)
CO2: 28 mEq/L (ref 19–32)
Calcium: 9.5 mg/dL (ref 8.4–10.5)
Chloride: 99 mEq/L (ref 96–112)
Creatinine, Ser: 1.07 mg/dL (ref 0.40–1.50)
GFR: 76.63 mL/min (ref >60.00)
Glucose, Bld: 142 mg/dL — ABNORMAL HIGH (ref 70–99)
Potassium: 4 mEq/L (ref 3.5–5.1)
Sodium: 137 mEq/L (ref 135–145)
Total Bilirubin: 0.5 mg/dL (ref 0.2–1.2)
Total Protein: 7.8 g/dL (ref 6.0–8.3)

## 2019-06-28 LAB — LIPID PANEL
Cholesterol: 147 mg/dL (ref 0–200)
HDL: 37.6 mg/dL — ABNORMAL LOW (ref >39.00)
NonHDL: 109.36
Total CHOL/HDL Ratio: 4
Triglycerides: 289 mg/dL — ABNORMAL HIGH (ref 0.0–149.0)
VLDL: 57.8 mg/dL — ABNORMAL HIGH (ref 0.0–40.0)

## 2019-06-28 LAB — HEMOGLOBIN A1C: Hgb A1c MFr Bld: 6.5 % (ref 4.6–6.5)

## 2019-06-28 LAB — LDL CHOLESTEROL, DIRECT: Direct LDL: 77 mg/dL

## 2019-06-28 MED ORDER — LISINOPRIL 10 MG PO TABS: ORAL_TABLET | ORAL | 1 refills | Status: DC

## 2019-06-28 MED ORDER — METFORMIN HCL 500 MG PO TABS: 500.0000 mg | ORAL_TABLET | Freq: Two times a day (BID) | ORAL | 1 refills | Status: DC

## 2019-06-28 MED ORDER — ATORVASTATIN CALCIUM 10 MG PO TABS: 10.0000 mg | ORAL_TABLET | Freq: Every day | ORAL | 1 refills | Status: DC

## 2019-06-28 MED ORDER — OMEPRAZOLE 40 MG PO CPDR: 40.0000 mg | DELAYED_RELEASE_CAPSULE | Freq: Every day | ORAL | 0 refills | Status: DC | PRN

## 2019-06-28 NOTE — Patient Instructions (Signed)
Please schedule a follow up with your eye doctor for exam.

## 2019-06-28 NOTE — Progress Notes (Signed)
Subjective:    Patient ID: Mario Ibarra, male    DOB: 1981-07-28, 38 y.o.   MRN: [id number]  HPI  DM2- has cut out all sugary drinks. Also cutting down on starches.  Lab Results  Component Value Date   HGBA1C 6.0 09/14/2018   HGBA1C 8.3 (H) 11/02/2016   HGBA1C 6.2 07/06/2015   Lab Results  Component Value Date   MICROALBUR 2.3 (H) 12/19/2017   LDLCALC 125 (H) 12/24/2010   CREATININE 1.05 09/14/2018   HTN-  BP Readings from Last 3 Encounters:  06/28/19 (!) 115/55  09/14/18 128/81  12/19/17 137/80   GERD- reports that he only uses prilosec every 4 days.   BP Readings from Last 3 Encounters:  06/28/19 (!) 115/55  09/14/18 128/81  12/19/17 137/80     Review of Systems See HPI  Past Medical History:  Diagnosis Date  . Chicken pox   . Diabetes type 2, uncontrolled (HCC) 01/27/2014  . Heartburn   . History of chicken pox   . Hypertension      Social History   Socioeconomic History  . Marital status: Married    Spouse name: Not on file  . Number of children: 1  . Years of education: Not on file  . Highest education level: Not on file  Occupational History  . 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 Financial resource strain: Not on file  . Food insecurity    Worry: Not on file    Inability: Not on file  . Transportation needs    Medical: Not on file    Non-medical: Not on file  Tobacco Use  . Smoking status: Never Smoker  . Smokeless tobacco: Never Used  Substance and Sexual Activity  . Alcohol use: Yes    Alcohol/week: 0.0 - 1.0 standard drinks  . Drug use: No  . Sexual activity: Yes    Birth control/protection: None  Lifestyle  . Physical activity    Days per week: Not on file    Minutes per session: Not on file  . Stress: Not on file  Relationships  . Social Musician on phone: Not on file    Gets together: Not on file    Attends religious service: Not on file     Active member of club or organization: Not on file    Attends meetings of clubs or organizations: Not on file    Relationship status: Not on file  . Intimate partner violence    Fear of current or ex partner: Not on file    Emotionally abused: Not on file    Physically abused: Not on file    Forced sexual activity: Not on file  Other Topics Concern  . Not on file  Social History Narrative   Married to [place]. 2 children [place] and [place].   Some college. 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   Drinks caffeinated beverages. Takes a daily vitamin.   Wears his seatbelt, smoke detector in the home.    Regular exercise: no    Past Surgical History:  Procedure Laterality Date  . KNEE SURGERY  age 38   bone spur? left knee    Family History  Problem Relation Age of Onset  . Diabetes Maternal Grandmother   . Heart disease Maternal Grandmother        CABG x 4  . Diabetes Paternal Grandfather   .  Kidney disease Paternal Grandfather        kidney failure due to diabetes?  . Diabetes Mellitus II Mother        "borderline"  . Sleep apnea Father   . Diabetes Maternal Aunt   . Heart disease Maternal Uncle   . Cancer Paternal Uncle   . Diabetes Paternal Uncle   . Arrhythmia Maternal Grandfather     No Known Allergies  Current Outpatient Medications on File Prior to Visit  Medication Sig Dispense Refill  . aspirin EC 81 MG tablet Take 1 tablet (81 mg total) by mouth daily.    Marland Kitchen glucose blood (ACCU-CHEK ACTIVE STRIPS) test strip Use as instructed 100 each 12  . Lancets (ACCU-CHEK MULTICLIX) lancets Use as instructed 100 each 12  . Omega-3 Fatty Acids (FISH OIL) 1000 MG CAPS Take 2,000 mg by mouth 2 (two) times daily. Reported on 11/20/2015     No current facility-administered medications on file prior to visit.     BP (!) 115/55   Pulse 70   Temp (!) 97.1 F (36.2 C) (Temporal)   Resp 16   Ht 5' 9.3" (1.76 m)   Wt 188 lb (85.3 kg)   SpO2 100%   BMI 27.52 kg/m       Objective:    Physical Exam Constitutional:      General: He is not in acute distress.    Appearance: He is well-developed.  HENT:     Head: Normocephalic and atraumatic.  Cardiovascular:     Rate and Rhythm: Normal rate and regular rhythm.     Heart sounds: No murmur.  Pulmonary:     Effort: Pulmonary effort is normal. No respiratory distress.     Breath sounds: Normal breath sounds. No wheezing or rales.  Skin:    General: Skin is warm and dry.  Neurological:     Mental Status: He is alert and oriented to person, place, and time.  Psychiatric:        Behavior: Behavior normal.        Thought Content: Thought content normal.           Assessment & Plan:  HTN- BP stable on current dose of lisinopril. Continue same.  GERD- improved since he has changed his diet. Continue prilosec prn.  Hyperlipidemia- on lipitor. Check follow up lipid panel.  DM2- control unknown. Obtain follow up A1C.   Declines flu shot.

## 2019-06-29 ENCOUNTER — Encounter: Payer: Self-pay | Admitting: Family

## 2020-01-02 ENCOUNTER — Other Ambulatory Visit: Payer: Self-pay | Admitting: Family

## 2020-04-03 ENCOUNTER — Other Ambulatory Visit: Payer: Self-pay | Admitting: Family

## 2020-04-08 LAB — HM DIABETES EYE EXAM

## 2020-05-02 ENCOUNTER — Other Ambulatory Visit: Payer: Self-pay | Admitting: Family

## 2020-05-19 ENCOUNTER — Other Ambulatory Visit: Payer: Self-pay | Admitting: Family

## 2020-05-22 ENCOUNTER — Other Ambulatory Visit: Payer: Self-pay | Admitting: Family

## 2020-05-23 MED ORDER — OMEPRAZOLE 40 MG PO CPDR: 40.0000 mg | DELAYED_RELEASE_CAPSULE | Freq: Every day | ORAL | 0 refills | Status: DC | PRN

## 2020-05-23 NOTE — Telephone Encounter (Signed)
Refill sent for 30 day further refills can be sent pending appt 05/25/20 @ 12:20. Pt also requesting Omeprozole Rx sent 30 day supply.

## 2020-05-25 ENCOUNTER — Ambulatory Visit: Payer: Managed Care, Other (non HMO) | Admitting: Family

## 2020-05-25 ENCOUNTER — Other Ambulatory Visit: Payer: Self-pay

## 2020-05-25 VITALS — BP 137/81 | HR 63 | Temp 98.4°F | Resp 16 | Ht 69.0 in | Wt 186.0 lb

## 2020-05-25 DIAGNOSIS — E1169 Type 2 diabetes mellitus with other specified complication: Secondary | ICD-10-CM

## 2020-05-25 DIAGNOSIS — E785 Hyperlipidemia, unspecified: Secondary | ICD-10-CM

## 2020-05-25 DIAGNOSIS — Z7185 Encounter for immunization safety counseling: Secondary | ICD-10-CM

## 2020-05-25 DIAGNOSIS — E1165 Type 2 diabetes mellitus with hyperglycemia: Secondary | ICD-10-CM | POA: Diagnosis not present

## 2020-05-25 DIAGNOSIS — I10 Essential (primary) hypertension: Secondary | ICD-10-CM

## 2020-05-25 DIAGNOSIS — Z7189 Other specified counseling: Secondary | ICD-10-CM

## 2020-05-25 MED ORDER — METFORMIN HCL 500 MG PO TABS: 500.0000 mg | ORAL_TABLET | Freq: Two times a day (BID) | ORAL | 1 refills | Status: DC

## 2020-05-25 MED ORDER — LISINOPRIL 10 MG PO TABS: 10.0000 mg | ORAL_TABLET | Freq: Every day | ORAL | 1 refills | Status: DC

## 2020-05-25 MED ORDER — OMEPRAZOLE 20 MG PO CPDR: 20.0000 mg | DELAYED_RELEASE_CAPSULE | Freq: Every day | ORAL | 3 refills | Status: DC

## 2020-05-25 MED ORDER — ATORVASTATIN CALCIUM 10 MG PO TABS: 10.0000 mg | ORAL_TABLET | Freq: Every day | ORAL | 1 refills | Status: DC

## 2020-05-25 NOTE — Patient Instructions (Signed)
Please complete lab work prior to leaving.   

## 2020-05-25 NOTE — Progress Notes (Signed)
Subjective:    Patient ID: Mario Ibarra, male    DOB: August 06, 1981, 39 y.o.   MRN: [id number]  HPI  Patient is a 39 yr old male who presents today for follow up.  DM2- maintained on metformin 500mg  bid.  Reports that he is eating about 2 meals a day.  Wt Readings from Last 3 Encounters:  05/25/20 186 lb (84.4 kg)  06/28/19 188 lb (85.3 kg)  09/14/18 186 lb (84.4 kg)    Lab Results  Component Value Date   HGBA1C 6.5 06/28/2019   HGBA1C 6.0 09/14/2018   HGBA1C 8.3 (H) 11/02/2016   Lab Results  Component Value Date   MICROALBUR 2.3 (H) 12/19/2017   LDLCALC 125 (H) 12/24/2010   CREATININE 1.07 06/28/2019   GERD- reports that this has improved. He is now using OTC omeprazole 20mg  OTC a few times a week.   Review of Systems See HPI  Past Medical History:  Diagnosis Date  . Chicken pox   . Diabetes type 2, uncontrolled (HCC) 01/27/2014  . Heartburn   . History of chicken pox   . Hypertension      Social History   Socioeconomic History  . Marital status: Married    Spouse name: Not on file  . Number of children: 1  . Years of education: Not on file  . Highest education level: Not on file  Occupational History  . Occupation: Heritage manager: Production assistant, radio FOR SELF EMPLOYED    Comment: [employer]  Tobacco Use  . Smoking status: Never Smoker  . Smokeless tobacco: Never Used  Substance and Sexual Activity  . Alcohol use: Yes    Alcohol/week: 0.0 - 1.0 standard drinks  . Drug use: No  . Sexual activity: Yes    Birth control/protection: None  Other Topics Concern  . Not on file  Social History Narrative   Married to [place]. 2 children [place] and [place].   Some college. 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   Drinks caffeinated beverages. Takes a daily vitamin.   Wears his seatbelt, smoke detector in the home.    Regular exercise: no   Social Determinants of Health   Financial Resource Strain:   . Difficulty of Paying Living Expenses: Not on file  Food  Insecurity:   . Worried About Programme researcher, broadcasting/film/video in the Last Year: Not on file  . Ran Out of Food in the Last Year: Not on file  Transportation Needs:   . Lack of Transportation (Medical): Not on file  . Lack of Transportation (Non-Medical): Not on file  Physical Activity:   . Days of Exercise per Week: Not on file  . Minutes of Exercise per Session: Not on file  Stress:   . Feeling of Stress : Not on file  Social Connections:   . Frequency of Communication with Friends and Family: Not on file  . Frequency of Social Gatherings with Friends and Family: Not on file  . Attends Religious Services: Not on file  . Active Member of Clubs or Organizations: Not on file  . Attends Banker Meetings: Not on file  . Marital Status: Not on file  Intimate Partner Violence:   . Fear of Current or Ex-Partner: Not on file  . Emotionally Abused: Not on file  . Physically Abused: Not on file  . Sexually Abused: Not on file    Past Surgical History:  Procedure Laterality Date  . KNEE SURGERY  age 49  bone spur? left knee    Family History  Problem Relation Age of Onset  . Diabetes Maternal Grandmother   . Heart disease Maternal Grandmother        CABG x 4  . Diabetes Paternal Grandfather   . Kidney disease Paternal Grandfather        kidney failure due to diabetes?  . Diabetes Mellitus II Mother        "borderline"  . Sleep apnea Father   . Diabetes Maternal Aunt   . Heart disease Maternal Uncle   . Cancer Paternal Uncle   . Diabetes Paternal Uncle   . Arrhythmia Maternal Grandfather     No Known Allergies  Current Outpatient Medications on File Prior to Visit  Medication Sig Dispense Refill  . aspirin EC 81 MG tablet Take 1 tablet (81 mg total) by mouth daily.    Marland Kitchen atorvastatin (LIPITOR) 10 MG tablet TAKE 1 TABLET BY MOUTH EVERY DAY 30 tablet 0  . glucose blood (ACCU-CHEK ACTIVE STRIPS) test strip Use as instructed 100 each 12  . Lancets (ACCU-CHEK MULTICLIX)  lancets Use as instructed 100 each 12  . lisinopril (ZESTRIL) 10 MG tablet TAKE 1 TABLET BY MOUTH EVERY DAY 30 tablet 0  . metFORMIN (GLUCOPHAGE) 500 MG tablet TAKE 1 TABLET (500 MG TOTAL) BY MOUTH 2 (TWO) TIMES DAILY WITH A MEAL. 60 tablet 0  . Omega-3 Fatty Acids (FISH OIL) 1000 MG CAPS Take 2,000 mg by mouth 2 (two) times daily. Reported on 11/20/2015    . omeprazole (PRILOSEC) 40 MG capsule Take 1 capsule (40 mg total) by mouth daily as needed (reflux). 30 capsule 0   No current facility-administered medications on file prior to visit.    BP 137/81 (BP Location: Left Arm, Patient Position: Sitting, Cuff Size: Small)   Pulse 63   Temp 98.4 F (36.9 C) (Oral)   Resp 16   Ht 5\' 9"  (1.753 m)   Wt 186 lb (84.4 kg)   SpO2 100%   BMI 27.47 kg/m       Objective:   Physical Exam Constitutional:      General: He is not in acute distress.    Appearance: He is well-developed.  HENT:     Head: Normocephalic and atraumatic.  Cardiovascular:     Rate and Rhythm: Normal rate and regular rhythm.     Heart sounds: No murmur heard.   Pulmonary:     Effort: Pulmonary effort is normal. No respiratory distress.     Breath sounds: Normal breath sounds. No wheezing or rales.  Skin:    General: Skin is warm and dry.  Neurological:     Mental Status: He is alert and oriented to person, place, and time.  Psychiatric:        Behavior: Behavior normal.        Thought Content: Thought content normal.           Assessment & Plan:  DM2- A1C remains at goal. Continue metformin 500 mg twice daily.   Lab Results  Component Value Date   HGBA1C 6.5 (H) 05/25/2020   GERD- stable with otc omeprazole prn. Continue OTC dosing (20mg  daily prn).   Immunization counseling- discussed importance of vaccination against covid-19.  Discussed that he is at increased risk for complications due to his history of DM and HTN.     HTN- patient is maintained on lisinopril 10mg . BP stable- continue  lisinopril.  Hyperlipidemia- maintained on lipitor 10mg  once daily. LDL at  goal- continue lipitor.   Lab Results  Component Value Date   CHOL 127 05/25/2020   HDL 36 (L) 05/25/2020   LDLCALC 63 05/25/2020   LDLDIRECT 77.0 06/28/2019   TRIG 210 (H) 05/25/2020   CHOLHDL 3.5 05/25/2020    This visit occurred during the SARS-CoV-2 public health emergency.  Safety protocols were in place, including screening questions prior to the visit, additional usage of staff PPE, and extensive cleaning of exam room while observing appropriate contact time as indicated for disinfecting solutions.

## 2020-05-26 ENCOUNTER — Telehealth: Payer: Self-pay | Admitting: Family

## 2020-05-26 ENCOUNTER — Encounter: Payer: Self-pay | Admitting: Family

## 2020-05-26 DIAGNOSIS — R7989 Other specified abnormal findings of blood chemistry: Secondary | ICD-10-CM

## 2020-05-26 LAB — COMPREHENSIVE METABOLIC PANEL
AG Ratio: 1.8 (calc) (ref 1.0–2.5)
ALT: 61 U/L — ABNORMAL HIGH (ref 9–46)
AST: 26 U/L (ref 10–40)
Albumin: 4.4 g/dL (ref 3.6–5.1)
Alkaline phosphatase (APISO): 87 U/L (ref 36–130)
BUN: 14 mg/dL (ref 7–25)
CO2: 27 mmol/L (ref 20–32)
Calcium: 9.5 mg/dL (ref 8.6–10.3)
Chloride: 101 mmol/L (ref 98–110)
Creat: 1.02 mg/dL (ref 0.60–1.35)
Globulin: 2.4 g/dL (calc) (ref 1.9–3.7)
Glucose, Bld: 122 mg/dL — ABNORMAL HIGH (ref 65–99)
Potassium: 4.2 mmol/L (ref 3.5–5.3)
Sodium: 137 mmol/L (ref 135–146)
Total Bilirubin: 0.4 mg/dL (ref 0.2–1.2)
Total Protein: 6.8 g/dL (ref 6.1–8.1)

## 2020-05-26 LAB — LIPID PANEL
Cholesterol: 127 mg/dL (ref <200)
HDL: 36 mg/dL — ABNORMAL LOW (ref >=40)
LDL Cholesterol (Calc): 63 mg/dL (calc)
Non-HDL Cholesterol (Calc): 91 mg/dL (calc) (ref <130)
Total CHOL/HDL Ratio: 3.5 (calc) (ref <5.0)
Triglycerides: 210 mg/dL — ABNORMAL HIGH (ref <150)

## 2020-05-26 LAB — HEMOGLOBIN A1C
Hgb A1c MFr Bld: 6.5 % of total Hgb — ABNORMAL HIGH (ref <5.7)
Mean Plasma Glucose: 140 (calc)
eAG (mmol/L): 7.7 (calc)

## 2020-05-26 NOTE — Telephone Encounter (Signed)
Spoke w/ Pt- informed that of results and recommendations. Pt verbalized understanding. He requests that I send him a mychart message with the telephone number to call and schedule ultrasound.

## 2020-05-26 NOTE — Telephone Encounter (Signed)
Please advise pt that one of his liver function tests is elevated.  I would recommend that we have him complete an ultrasound to check his liver.    Also, sugar remains at goal but triglycerides are mildly elevated. Please work on avoiding concentrated sweets, and limiting white carbs (rice/bread/pasta/potatoes). Instead substitute whole grain versions with reasonable portions.

## 2020-06-02 ENCOUNTER — Ambulatory Visit (HOSPITAL_BASED_OUTPATIENT_CLINIC_OR_DEPARTMENT_OTHER)
Admission: RE | Admit: 2020-06-02 | Discharge: 2020-06-02 | Disposition: A | Discharge status: Home / Self Care | Payer: Managed Care, Other (non HMO) | Source: Ambulatory Visit | Attending: Family | Admitting: Family

## 2020-06-02 ENCOUNTER — Encounter: Payer: Self-pay | Admitting: Family

## 2020-06-02 ENCOUNTER — Telehealth: Payer: Self-pay | Admitting: Family

## 2020-06-02 ENCOUNTER — Other Ambulatory Visit: Payer: Self-pay

## 2020-06-02 DIAGNOSIS — R945 Abnormal results of liver function studies: Secondary | ICD-10-CM | POA: Insufficient documentation

## 2020-06-02 DIAGNOSIS — K760 Fatty (change of) liver, not elsewhere classified: Secondary | ICD-10-CM

## 2020-06-02 NOTE — Telephone Encounter (Signed)
Please contact patient and let him know that I reviewed the results of his abdominal ultrasound.  Abdominal ultrasound confirms fatty liver.  This is likely cause for his elevated liver function testing.  Best treatment for fatty liver is low-fat, low-cholesterol diet, exercise and weight loss.

## 2020-06-03 NOTE — Telephone Encounter (Signed)
Results given to patient, he verbalized understanding.  

## 2020-08-13 ENCOUNTER — Other Ambulatory Visit: Payer: Self-pay | Admitting: Family

## 2020-11-18 ENCOUNTER — Other Ambulatory Visit: Payer: Self-pay | Admitting: Family

## 2021-01-05 ENCOUNTER — Telehealth: Payer: Self-pay | Admitting: Family

## 2021-01-05 ENCOUNTER — Other Ambulatory Visit: Payer: Self-pay | Admitting: Family

## 2021-01-05 NOTE — Telephone Encounter (Signed)
Please contact pt and let him know that I sent a 30 day rx refill but he is due for follow up.

## 2021-01-06 NOTE — Telephone Encounter (Signed)
Appt scheduled with pt.

## 2021-02-10 ENCOUNTER — Telehealth: Payer: Self-pay | Admitting: Family

## 2021-02-10 ENCOUNTER — Ambulatory Visit: Payer: Managed Care, Other (non HMO) | Admitting: Family

## 2021-02-10 ENCOUNTER — Other Ambulatory Visit: Payer: Self-pay

## 2021-02-10 MED ORDER — OMEPRAZOLE 20 MG PO CPDR: 20.0000 mg | DELAYED_RELEASE_CAPSULE | Freq: Every day | ORAL | 0 refills | Status: DC

## 2021-02-10 MED ORDER — ATORVASTATIN CALCIUM 10 MG PO TABS: 1.0000 | ORAL_TABLET | Freq: Every day | ORAL | 0 refills | Status: DC

## 2021-02-10 MED ORDER — METFORMIN HCL 500 MG PO TABS: 500.0000 mg | ORAL_TABLET | Freq: Two times a day (BID) | ORAL | 0 refills | Status: DC

## 2021-02-10 NOTE — Telephone Encounter (Signed)
Medication: lisinopril (ZESTRIL) 10 MG tablet [[id number]]   atorvastatin (LIPITOR) 10 MG tablet [[id number]]    metFORMIN (GLUCOPHAGE) 500 MG tablet [[id number]]   omeprazole (PRILOSEC) 20 MG capsule [[id number]]  Has the patient contacted their pharmacy? No. (If no, request that the patient contact the pharmacy for the refill.) (If yes, when and what did the pharmacy advise?) Pt has an appt in july    Preferred Pharmacy (with phone number or street name): [id number] - [place], [place] - [address]  [address], [place] [address]  Phone:  [phone]  Fax:  [phone]  DEA #:  [id number]     Agent: Please be advised that RX refills may take up to 3 business days. We ask that you follow-up with your pharmacy.

## 2021-02-10 NOTE — Telephone Encounter (Signed)
Refills sent

## 2021-02-15 ENCOUNTER — Other Ambulatory Visit: Payer: Self-pay | Admitting: Family

## 2021-02-26 ENCOUNTER — Other Ambulatory Visit: Payer: Self-pay

## 2021-02-26 ENCOUNTER — Ambulatory Visit: Payer: Managed Care, Other (non HMO) | Admitting: Family

## 2021-02-26 ENCOUNTER — Encounter: Payer: Self-pay | Admitting: Family

## 2021-02-26 VITALS — BP 118/82 | HR 66 | Temp 97.8°F | Resp 15 | Ht 69.0 in | Wt 179.0 lb

## 2021-02-26 DIAGNOSIS — E785 Hyperlipidemia, unspecified: Secondary | ICD-10-CM

## 2021-02-26 DIAGNOSIS — E1165 Type 2 diabetes mellitus with hyperglycemia: Secondary | ICD-10-CM

## 2021-02-26 DIAGNOSIS — Z7185 Encounter for immunization safety counseling: Secondary | ICD-10-CM | POA: Diagnosis not present

## 2021-02-26 DIAGNOSIS — I10 Essential (primary) hypertension: Secondary | ICD-10-CM | POA: Diagnosis not present

## 2021-02-26 DIAGNOSIS — K219 Gastro-esophageal reflux disease without esophagitis: Secondary | ICD-10-CM

## 2021-02-26 MED ORDER — OMEPRAZOLE 20 MG PO CPDR: 20.0000 mg | DELAYED_RELEASE_CAPSULE | Freq: Every day | ORAL | 1 refills | Status: DC

## 2021-02-26 MED ORDER — ATORVASTATIN CALCIUM 10 MG PO TABS: 10.0000 mg | ORAL_TABLET | Freq: Every day | ORAL | 1 refills | Status: DC

## 2021-02-26 MED ORDER — LISINOPRIL 10 MG PO TABS: 10.0000 mg | ORAL_TABLET | Freq: Every day | ORAL | 1 refills | Status: DC

## 2021-02-26 MED ORDER — METFORMIN HCL 500 MG PO TABS: 500.0000 mg | ORAL_TABLET | Freq: Two times a day (BID) | ORAL | 1 refills | Status: DC

## 2021-02-26 NOTE — Assessment & Plan Note (Signed)
Clinically stable on metformin 500mg  bid. Continue same. Check A1C.

## 2021-02-26 NOTE — Assessment & Plan Note (Signed)
BP stable. Continue lisinopril 81mg  once daily.

## 2021-02-26 NOTE — Assessment & Plan Note (Signed)
Discussed importance of covid vaccination- he declines at this time.

## 2021-02-26 NOTE — Assessment & Plan Note (Signed)
Stable with use of omeprazole 20mg  once daily prn.

## 2021-02-26 NOTE — Progress Notes (Signed)
Subjective:     Patient ID: Mario Ibarra, male    DOB: 01-17-1981, 40 y.o.   MRN: [id number]  Chief Complaint  Patient presents with   Medication Refill    HPI Patient is in today for follow up.   DM2- maintained on metformin.  He eats 2 meals a day. And a mid day protein shake.   Lab Results  Component Value Date   HGBA1C 6.5 (H) 05/25/2020   HGBA1C 6.5 06/28/2019   HGBA1C 6.0 09/14/2018   Lab Results  Component Value Date   MICROALBUR 2.3 (H) 12/19/2017   LDLCALC 63 05/25/2020   CREATININE 1.02 05/25/2020   HTN- BP Readings from Last 3 Encounters:  02/26/21 118/82  05/25/20 137/81  06/28/19 (!) 115/55   Maintained on lisinopril 10mg .   GERD- reports that symptoms are stable on omprazole prn.   Hyperlipidemia- tolerating atorvastatin.  Health Maintenance Due  Topic Date Due   TETANUS/TDAP  08/29/2018   HEMOGLOBIN A1C  11/22/2020    Past Medical History:  Diagnosis Date   Chicken pox    Diabetes type 2, uncontrolled (HCC) 01/27/2014   Fatty liver    Heartburn    History of chicken pox    Hypertension     Past Surgical History:  Procedure Laterality Date   KNEE SURGERY  age 29   bone spur? left knee    Family History  Problem Relation Age of Onset   Diabetes Maternal Grandmother    Heart disease Maternal Grandmother        CABG x 4   Diabetes Paternal Grandfather    Kidney disease Paternal Grandfather        kidney failure due to diabetes?   Diabetes Mellitus II Mother        "borderline"   Sleep apnea Father    Diabetes Maternal Aunt    Heart disease Maternal Uncle    Cancer Paternal Uncle    Diabetes Paternal Uncle    Arrhythmia Maternal Grandfather     Social History   Socioeconomic History   Marital status: Married    Spouse name: Not on file   Number of children: 1   Years of education: Not on file   Highest education level: Not on file  Occupational History   Occupation: Heritage manager: Production assistant, radio FOR SELF EMPLOYED     Comment: [employer]  Tobacco Use   Smoking status: Never   Smokeless tobacco: Never  Substance and Sexual Activity   Alcohol use: Yes    Alcohol/week: 0.0 - 1.0 standard drinks   Drug use: No   Sexual activity: Yes    Birth control/protection: None  Other Topics Concern   Not on file  Social History Narrative   Married to Mario Ibarra. 2 children [place] and [place].   Some college. 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   Drinks caffeinated beverages. Takes a daily vitamin.   Wears his seatbelt, smoke detector in the home.    Regular exercise: no   Social Determinants of Corporate investment banker Strain: Not on file  Food Insecurity: Not on file  Transportation Needs: Not on file  Physical Activity: Not on file  Stress: Not on file  Social Connections: Not on file  Intimate Partner Violence: Not on file    Outpatient Medications Prior to Visit  Medication Sig Dispense Refill   aspirin EC 81 MG tablet Take 1 tablet (81 mg total) by mouth daily.  glucose blood (ACCU-CHEK ACTIVE STRIPS) test strip Use as instructed 100 each 12   Lancets (ACCU-CHEK MULTICLIX) lancets Use as instructed 100 each 12   Omega-3 Fatty Acids (FISH OIL) 1000 MG CAPS Take 2,000 mg by mouth 2 (two) times daily. Reported on 11/20/2015     atorvastatin (LIPITOR) 10 MG tablet Take 1 tablet (10 mg total) by mouth daily. 30 tablet 0   lisinopril (ZESTRIL) 10 MG tablet TAKE 1 TABLET BY MOUTH EVERY DAY 30 tablet 0   metFORMIN (GLUCOPHAGE) 500 MG tablet Take 1 tablet (500 mg total) by mouth 2 (two) times daily with a meal. 60 tablet 0   omeprazole (PRILOSEC) 20 MG capsule Take 1 capsule (20 mg total) by mouth daily. 30 capsule 0   No facility-administered medications prior to visit.    No Known Allergies  ROS See HPI    Objective:    Physical Exam Constitutional:      General: He is not in acute distress.    Appearance: He is well-developed.  HENT:     Head: Normocephalic and atraumatic.  Cardiovascular:      Rate and Rhythm: Normal rate and regular rhythm.     Heart sounds: No murmur heard. Pulmonary:     Effort: Pulmonary effort is normal. No respiratory distress.     Breath sounds: Normal breath sounds. No wheezing or rales.  Skin:    General: Skin is warm and dry.  Neurological:     Mental Status: He is alert and oriented to person, place, and time.  Psychiatric:        Behavior: Behavior normal.        Thought Content: Thought content normal.    BP 118/82 (BP Location: Right Arm, Patient Position: Sitting, Cuff Size: Large)   Pulse 66   Temp 97.8 F (36.6 C)   Resp 15   Ht 5\' 9"  (1.753 m)   Wt 179 lb (81.2 kg)   SpO2 97%   BMI 26.43 kg/m  Wt Readings from Last 3 Encounters:  02/26/21 179 lb (81.2 kg)  05/25/20 186 lb (84.4 kg)  06/28/19 188 lb (85.3 kg)       Assessment & Plan:   Problem List Items Addressed This Visit       Unprioritized   Vaccine counseling    Discussed importance of covid vaccination- he declines at this time.        Hyperlipidemia    Obtain follow up lipid panel- continue atorvastatin 10mg .        Relevant Medications   atorvastatin (LIPITOR) 10 MG tablet   lisinopril (ZESTRIL) 10 MG tablet   Other Relevant Orders   Lipid panel   HTN (hypertension)    BP stable. Continue lisinopril 81mg  once daily.        Relevant Medications   atorvastatin (LIPITOR) 10 MG tablet   lisinopril (ZESTRIL) 10 MG tablet   GERD (gastroesophageal reflux disease)    Stable with use of omeprazole 20mg  once daily prn.        Relevant Medications   omeprazole (PRILOSEC) 20 MG capsule   Diabetes type 2, uncontrolled (HCC) - Primary    Clinically stable on metformin 500mg  bid. Continue same. Check A1C.        Relevant Medications   atorvastatin (LIPITOR) 10 MG tablet   lisinopril (ZESTRIL) 10 MG tablet   metFORMIN (GLUCOPHAGE) 500 MG tablet   Other Relevant Orders   Hemoglobin A1c   Comp Met (CMET)    I  have changed Mario Ibarra's  lisinopril. I am also having him maintain his Fish Oil, aspirin EC, accu-chek multiclix, glucose blood, atorvastatin, metFORMIN, and omeprazole.  Meds ordered this encounter  Medications   atorvastatin (LIPITOR) 10 MG tablet    Sig: Take 1 tablet (10 mg total) by mouth daily.    Dispense:  90 tablet    Refill:  1    Order Specific Question:   Supervising Provider    Answer:   Danise Edge A [[id number]]   lisinopril (ZESTRIL) 10 MG tablet    Sig: Take 1 tablet (10 mg total) by mouth daily.    Dispense:  90 tablet    Refill:  1    Order Specific Question:   Supervising Provider    Answer:   Danise Edge A [[id number]]   metFORMIN (GLUCOPHAGE) 500 MG tablet    Sig: Take 1 tablet (500 mg total) by mouth 2 (two) times daily with a meal.    Dispense:  180 tablet    Refill:  1    Order Specific Question:   Supervising Provider    Answer:   Danise Edge A [[id number]]   omeprazole (PRILOSEC) 20 MG capsule    Sig: Take 1 capsule (20 mg total) by mouth daily.    Dispense:  90 capsule    Refill:  1    Order Specific Question:   Supervising Provider    Answer:   Danise Edge A [[id number]]

## 2021-02-26 NOTE — Assessment & Plan Note (Signed)
Obtain follow up lipid panel- continue atorvastatin 10mg .

## 2021-02-27 LAB — HEMOGLOBIN A1C
Hgb A1c MFr Bld: 6.3 % of total Hgb — ABNORMAL HIGH (ref <5.7)
Mean Plasma Glucose: 134 mg/dL
eAG (mmol/L): 7.4 mmol/L

## 2021-02-27 LAB — LIPID PANEL
Cholesterol: 143 mg/dL (ref <200)
HDL: 38 mg/dL — ABNORMAL LOW (ref >=40)
LDL Cholesterol (Calc): 67 mg/dL (calc)
Non-HDL Cholesterol (Calc): 105 mg/dL (calc) (ref <130)
Total CHOL/HDL Ratio: 3.8 (calc) (ref <5.0)
Triglycerides: 319 mg/dL — ABNORMAL HIGH (ref <150)

## 2021-02-27 LAB — COMPREHENSIVE METABOLIC PANEL
AG Ratio: 1.8 (calc) (ref 1.0–2.5)
ALT: 77 U/L — ABNORMAL HIGH (ref 9–46)
AST: 30 U/L (ref 10–40)
Albumin: 4.7 g/dL (ref 3.6–5.1)
Alkaline phosphatase (APISO): 89 U/L (ref 36–130)
BUN: 13 mg/dL (ref 7–25)
CO2: 25 mmol/L (ref 20–32)
Calcium: 10.4 mg/dL — ABNORMAL HIGH (ref 8.6–10.3)
Chloride: 98 mmol/L (ref 98–110)
Creat: 0.91 mg/dL (ref 0.60–1.35)
Globulin: 2.6 g/dL (calc) (ref 1.9–3.7)
Glucose, Bld: 109 mg/dL — ABNORMAL HIGH (ref 65–99)
Potassium: 4.4 mmol/L (ref 3.5–5.3)
Sodium: 136 mmol/L (ref 135–146)
Total Bilirubin: 0.5 mg/dL (ref 0.2–1.2)
Total Protein: 7.3 g/dL (ref 6.1–8.1)

## 2021-03-02 ENCOUNTER — Telehealth: Payer: Self-pay | Admitting: Family

## 2021-03-02 DIAGNOSIS — R7989 Other specified abnormal findings of blood chemistry: Secondary | ICD-10-CM

## 2021-03-02 MED ORDER — FENOFIBRATE 145 MG PO TABS: 145.0000 mg | ORAL_TABLET | Freq: Every day | ORAL | 3 refills | Status: DC

## 2021-03-02 NOTE — Telephone Encounter (Signed)
Triglycerides are still elevated. Please continue diabetic diet, exercise and weight loss. Also, I would like him to add fenofibrate once daily to lower triglycerides.   Diabetes is at goal.  Liver testing is slightly worse than last time. Fatty liver disease is likely cause.   His calcium is mildly elevated as well. I would like him to do some additional lab work to evaluate liver and calcium and I have placed the orders.

## 2021-03-02 NOTE — Telephone Encounter (Signed)
Patient advised of results, new rx for fenofibrate, and provider's advise for diet and exercise. He verbalized understanding. He was schedule for additional labs tomorrow.

## 2021-03-03 ENCOUNTER — Other Ambulatory Visit: Payer: Self-pay

## 2021-03-03 ENCOUNTER — Other Ambulatory Visit (INDEPENDENT_AMBULATORY_CARE_PROVIDER_SITE_OTHER): Payer: Managed Care, Other (non HMO)

## 2021-03-03 DIAGNOSIS — R945 Abnormal results of liver function studies: Secondary | ICD-10-CM

## 2021-03-03 DIAGNOSIS — R7989 Other specified abnormal findings of blood chemistry: Secondary | ICD-10-CM

## 2021-03-03 LAB — IRON: Iron: 92 ug/dL (ref 42–165)

## 2021-03-04 LAB — PTH, INTACT AND CALCIUM
Calcium: 9.6 mg/dL (ref 8.6–10.3)
PTH: 18 pg/mL (ref 16–77)

## 2021-03-04 LAB — HEPATITIS PANEL, ACUTE
Hep A IgM: NONREACTIVE
Hep B C IgM: NONREACTIVE
Hepatitis B Surface Ag: NONREACTIVE
Hepatitis C Ab: NONREACTIVE
SIGNAL TO CUT-OFF: 0.01 (ref <1.00)

## 2021-03-05 ENCOUNTER — Other Ambulatory Visit (INDEPENDENT_AMBULATORY_CARE_PROVIDER_SITE_OTHER): Payer: Managed Care, Other (non HMO)

## 2021-03-05 DIAGNOSIS — R945 Abnormal results of liver function studies: Secondary | ICD-10-CM

## 2021-03-05 DIAGNOSIS — R7989 Other specified abnormal findings of blood chemistry: Secondary | ICD-10-CM

## 2021-03-05 LAB — FERRITIN: Ferritin: 53.7 ng/mL (ref 22.0–322.0)

## 2021-07-04 ENCOUNTER — Other Ambulatory Visit: Payer: Self-pay | Admitting: Family

## 2021-08-05 ENCOUNTER — Other Ambulatory Visit: Payer: Self-pay | Admitting: Family

## 2021-08-27 IMAGING — US US ABDOMEN COMPLETE
1 series · 14 of 25 positions shown · non-contrast
Comparison: None.

CLINICAL DATA: Abnormal LFTs

EXAM:
ABDOMEN ULTRASOUND COMPLETE

[Series 1: us abdomen complete · 14 of 89 slices shown]
[im 1/89]
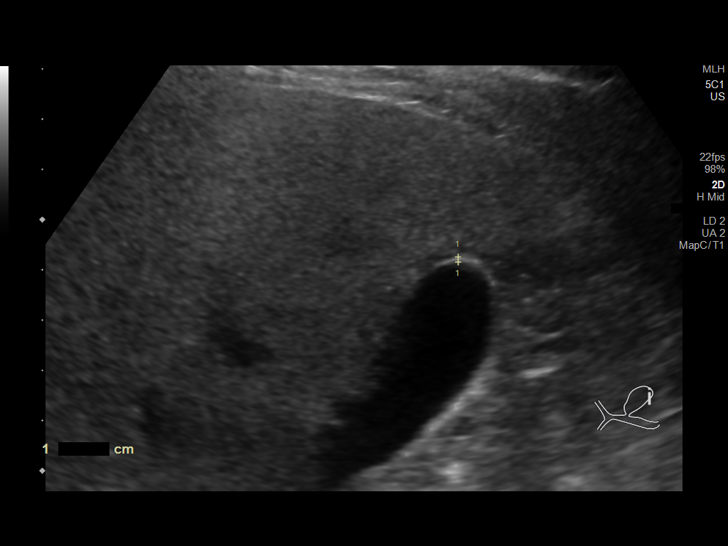
[im 8/89]
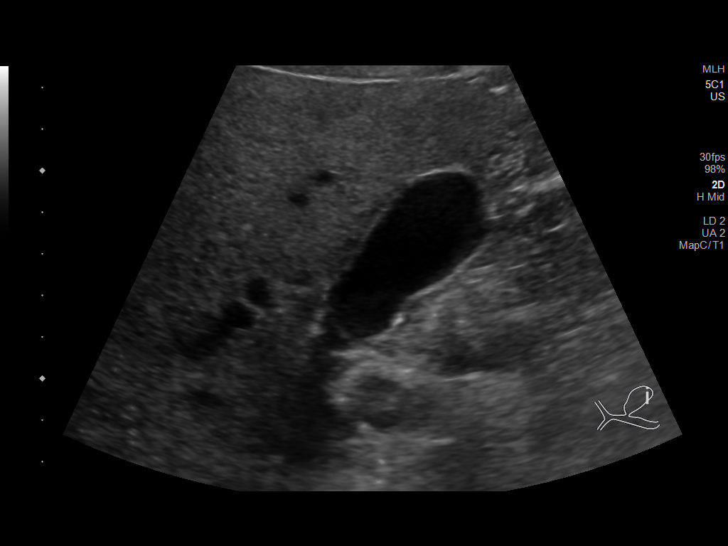
[im 15/89]
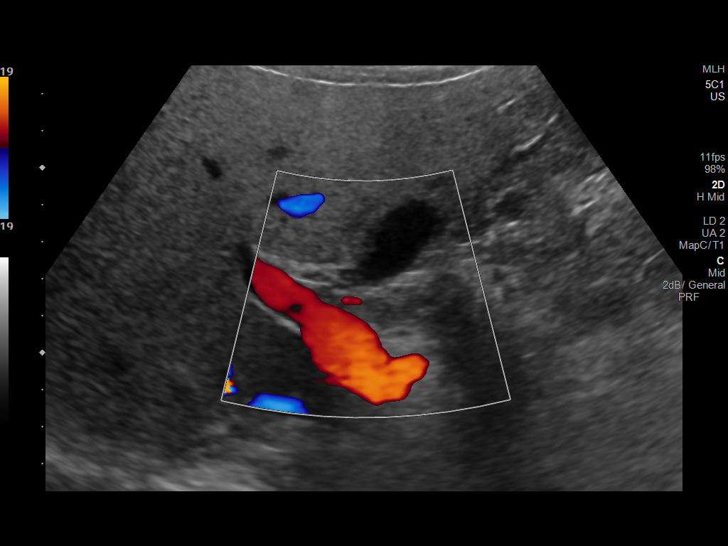
[im 23/89]
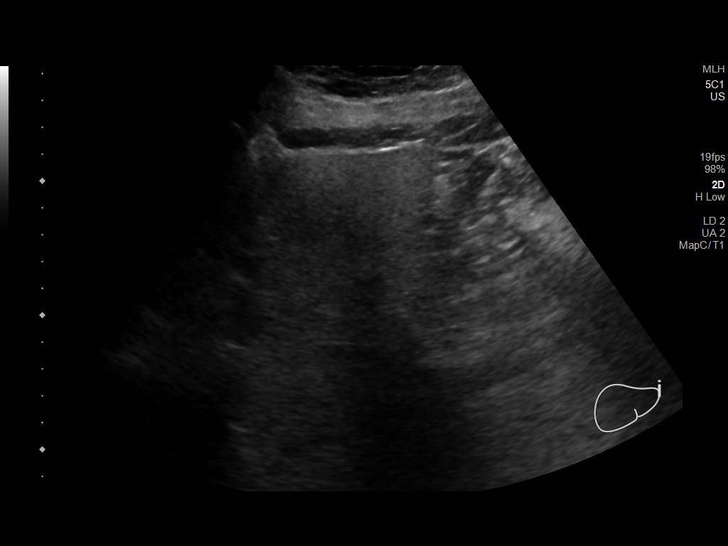
[im 30/89]
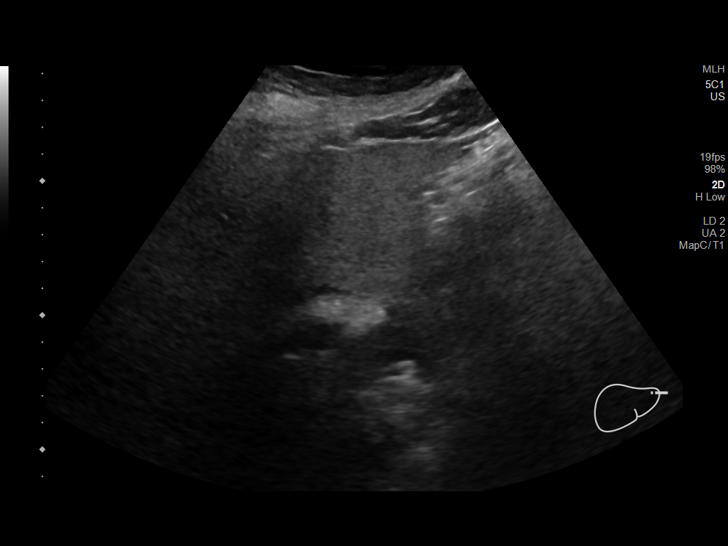
[im 34/89]
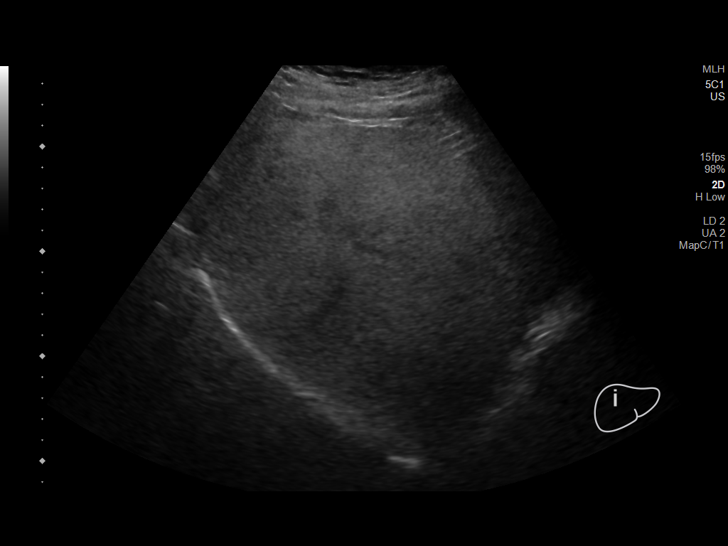
[im 41/89]
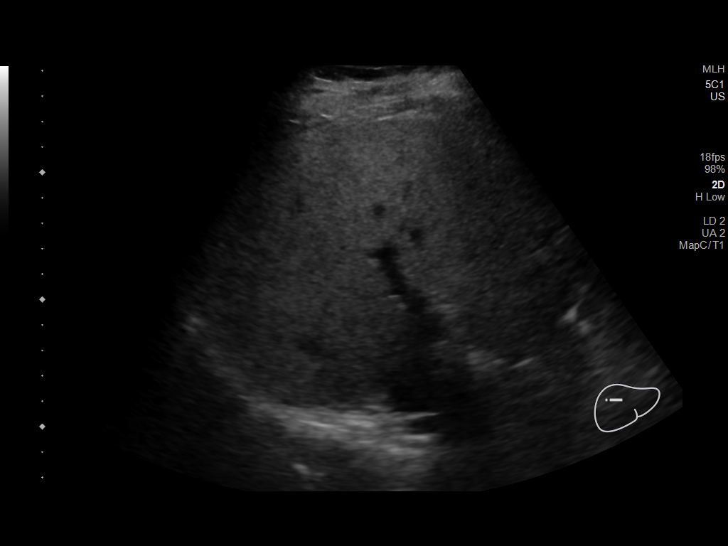
[im 48/89]
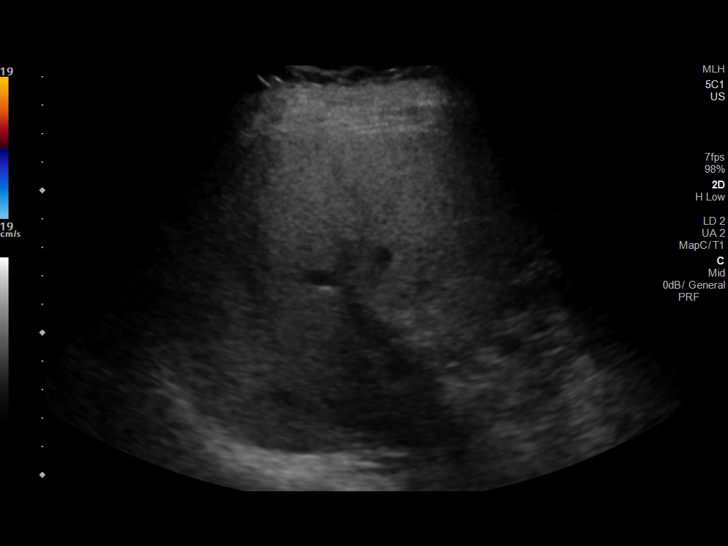
[im 56/89]
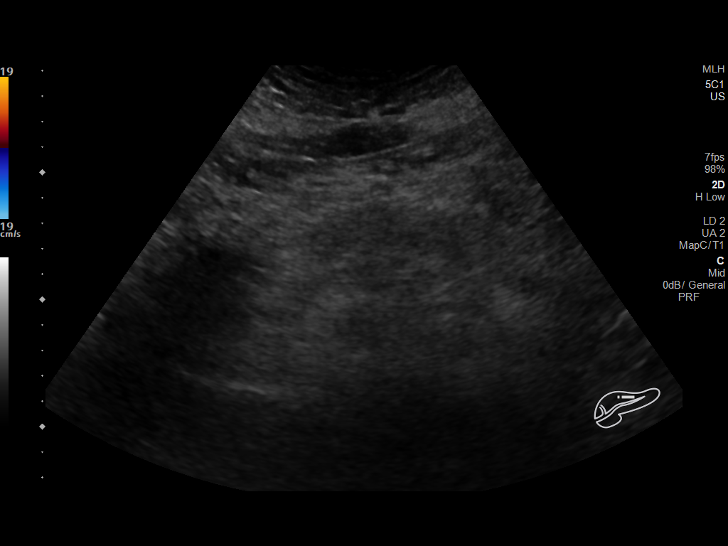
[im 59/89]
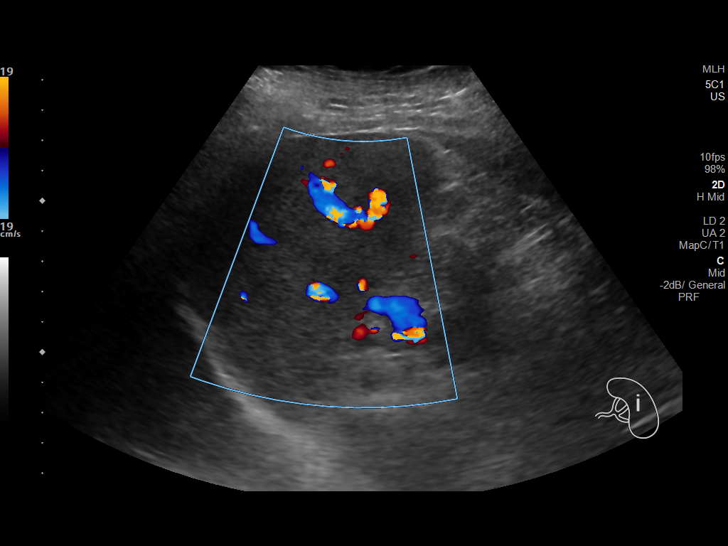
[im 67/89]
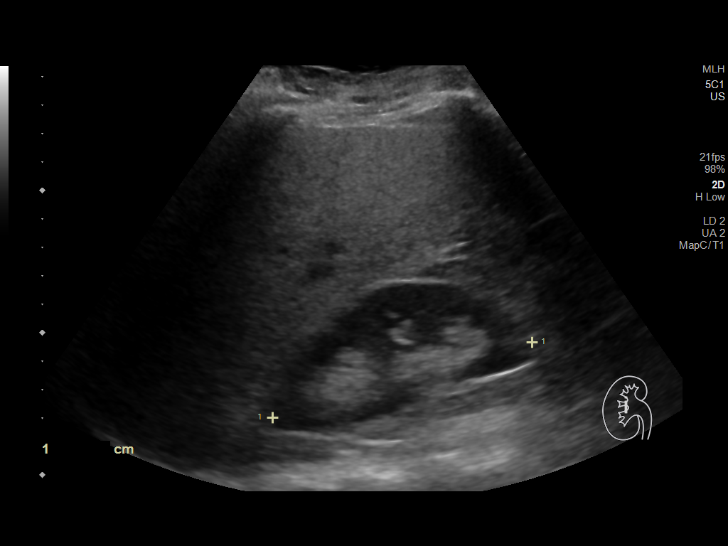
[im 74/89]
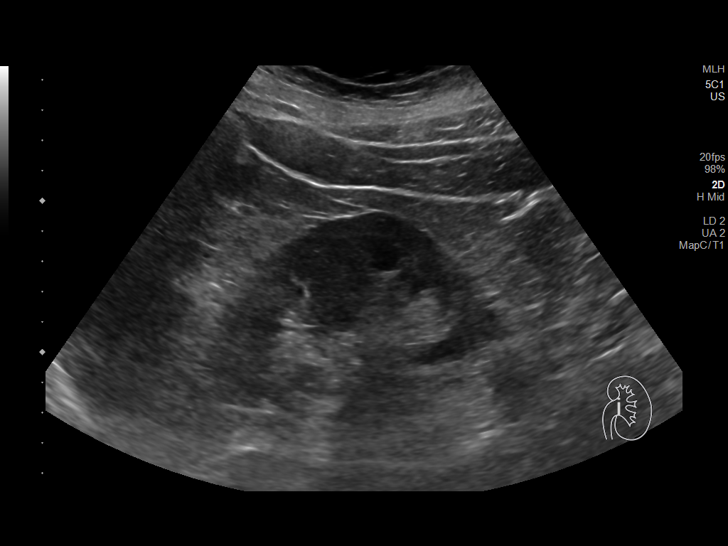
[im 81/89]
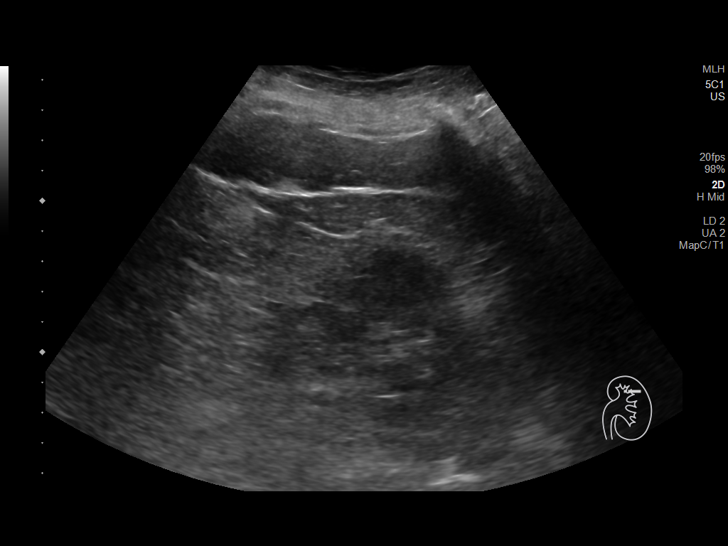
[im 89/89]
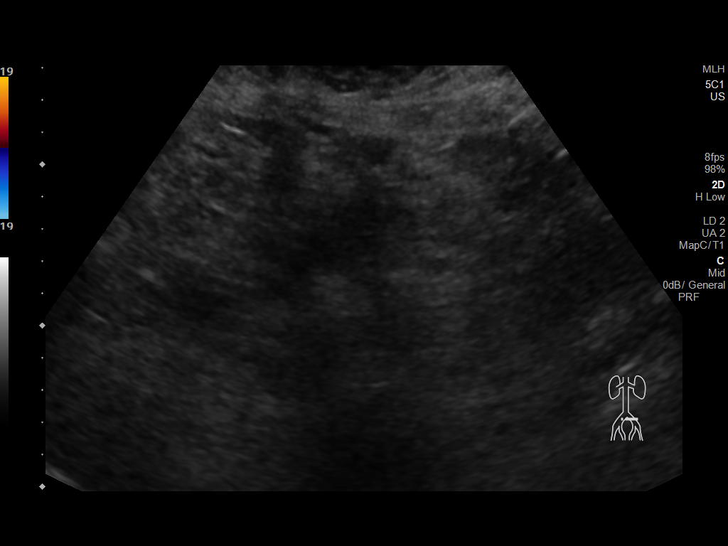

[14 of 25 positions shown; findings below may reference images not displayed]

FINDINGS: Gallbladder: No gallstones or wall thickening visualized. No
sonographic Murphy sign noted by sonographer.

Common bile duct: Diameter: 2.5 mm

Liver: No focal lesion identified. Increased hepatic parenchymal
echogenicity. Portal vein is patent on color Doppler imaging with
normal direction of blood flow towards the liver.

IVC: No abnormality visualized.

Pancreas: Visualized portion unremarkable.

Spleen: Size and appearance within normal limits.

Right Kidney: Length: 9.5 cm. Echogenicity within normal limits. No
mass or hydronephrosis visualized.

Left Kidney: Length: 10.5 cm. Echogenicity within normal limits. No
mass or hydronephrosis visualized.

Abdominal aorta: Abdominal aorta measures 2.5 cm in diameter.

Other findings: None.
IMPRESSION: 1. No cholelithiasis or sonographic evidence of acute cholecystitis.
2. Increased hepatic echogenicity as can be seen with hepatic
steatosis.

## 2021-09-04 ENCOUNTER — Other Ambulatory Visit: Payer: Self-pay | Admitting: Family

## 2021-09-05 ENCOUNTER — Telehealth: Payer: Self-pay | Admitting: Family

## 2021-09-05 NOTE — Telephone Encounter (Signed)
Please contact pt to schedule a follow up visit. I did send a 30 day supply of his fenofibrate.

## 2021-09-06 NOTE — Telephone Encounter (Signed)
Left voice mail for patient to call back and schedule a follow up appointment in the next 2 weeks.

## 2021-10-05 ENCOUNTER — Other Ambulatory Visit: Payer: Self-pay | Admitting: Family

## 2021-11-02 ENCOUNTER — Other Ambulatory Visit: Payer: Self-pay | Admitting: Family

## 2021-12-05 ENCOUNTER — Other Ambulatory Visit: Payer: Self-pay | Admitting: Family

## 2022-01-11 ENCOUNTER — Other Ambulatory Visit: Payer: Self-pay | Admitting: Family

## 2022-01-11 NOTE — Telephone Encounter (Signed)
I sent his refill, but he is overdue for follow up. Please contact him to schedule.  ?

## 2022-01-12 NOTE — Telephone Encounter (Signed)
Patient scheduled for 01-31-22 ?

## 2022-01-31 ENCOUNTER — Ambulatory Visit: Payer: Managed Care, Other (non HMO) | Admitting: Family

## 2022-01-31 ENCOUNTER — Telehealth: Payer: Self-pay | Admitting: Family

## 2022-01-31 DIAGNOSIS — E119 Type 2 diabetes mellitus without complications: Secondary | ICD-10-CM | POA: Diagnosis not present

## 2022-01-31 DIAGNOSIS — R7989 Other specified abnormal findings of blood chemistry: Secondary | ICD-10-CM | POA: Diagnosis not present

## 2022-01-31 DIAGNOSIS — I10 Essential (primary) hypertension: Secondary | ICD-10-CM | POA: Diagnosis not present

## 2022-01-31 DIAGNOSIS — E785 Hyperlipidemia, unspecified: Secondary | ICD-10-CM

## 2022-01-31 LAB — COMPREHENSIVE METABOLIC PANEL
ALT: 50 U/L (ref 0–53)
AST: 26 U/L (ref 0–37)
Albumin: 4.6 g/dL (ref 3.5–5.2)
Alkaline Phosphatase: 50 U/L (ref 39–117)
BUN: 16 mg/dL (ref 6–23)
CO2: 27 mEq/L (ref 19–32)
Calcium: 9.7 mg/dL (ref 8.4–10.5)
Chloride: 102 mEq/L (ref 96–112)
Creatinine, Ser: 1.08 mg/dL (ref 0.40–1.50)
GFR: 84.72 mL/min (ref >60.00)
Glucose, Bld: 128 mg/dL — ABNORMAL HIGH (ref 70–99)
Potassium: 4.6 mEq/L (ref 3.5–5.1)
Sodium: 136 mEq/L (ref 135–145)
Total Bilirubin: 0.4 mg/dL (ref 0.2–1.2)
Total Protein: 7.2 g/dL (ref 6.0–8.3)

## 2022-01-31 LAB — LIPID PANEL
Cholesterol: 135 mg/dL (ref 0–200)
HDL: 47.1 mg/dL (ref >39.00)
LDL Cholesterol: 71 mg/dL (ref 0–99)
NonHDL: 87.85
Total CHOL/HDL Ratio: 3
Triglycerides: 83 mg/dL (ref 0.0–149.0)
VLDL: 16.6 mg/dL (ref 0.0–40.0)

## 2022-01-31 LAB — MICROALBUMIN / CREATININE URINE RATIO
Creatinine,U: 72.4 mg/dL
Microalb Creat Ratio: 1 mg/g (ref 0.0–30.0)
Microalb, Ur: <0.7 mg/dL (ref 0.0–1.9)

## 2022-01-31 LAB — HEMOGLOBIN A1C: Hgb A1c MFr Bld: 6.2 % (ref 4.6–6.5)

## 2022-01-31 MED ORDER — METFORMIN HCL 500 MG PO TABS: 500.0000 mg | ORAL_TABLET | Freq: Two times a day (BID) | ORAL | 1 refills | Status: DC

## 2022-01-31 MED ORDER — ATORVASTATIN CALCIUM 10 MG PO TABS: 10.0000 mg | ORAL_TABLET | Freq: Every day | ORAL | 1 refills | Status: DC

## 2022-01-31 MED ORDER — FENOFIBRATE 145 MG PO TABS: 145.0000 mg | ORAL_TABLET | Freq: Every day | ORAL | 1 refills | Status: DC

## 2022-01-31 MED ORDER — LISINOPRIL 10 MG PO TABS: 10.0000 mg | ORAL_TABLET | Freq: Every day | ORAL | 1 refills | Status: DC

## 2022-01-31 MED ORDER — OMEPRAZOLE 20 MG PO CPDR
20.0000 mg | DELAYED_RELEASE_CAPSULE | Freq: Every day | ORAL | 1 refills | Status: DC | PRN
Start: 2022-01-31 — End: 2022-10-10

## 2022-01-31 NOTE — Assessment & Plan Note (Addendum)
Lab Results  Component Value Date   CHOL 143 02/26/2021   HDL 38 (L) 02/26/2021   LDLCALC 67 02/26/2021   LDLDIRECT 77.0 06/28/2019   TRIG 319 (H) 02/26/2021   CHOLHDL 3.8 02/26/2021   Check FLP today. Continue meds as below.

## 2022-01-31 NOTE — Telephone Encounter (Signed)
Request faxed

## 2022-01-31 NOTE — Progress Notes (Signed)
 Subjective:   By signing my name below, I, Shehryar Baig, attest that this documentation has been prepared under the direction and in the presence of O'Sullivan, Melissa, NP 01/31/2022    Patient ID: Mario Ibarra, male    DOB: 03/01/1981, 41 y.o.   MRN: [id number]  Chief Complaint  Patient presents with   Diabetes    Here for follow up    Diabetes  Patient is in today for a follow up visit.   Refills- He is requesting a refill on 10 mg atorvastatin, 145 mg fenofibrate, 10 mg lisinopril, 500 mg metformin, 20 mg omeprazole.  Weight- His weight is stable during this visit.  Wt Readings from Last 3 Encounters:  01/31/22 177 lb (80.3 kg)  02/26/21 179 lb (81.2 kg)  05/25/20 186 lb (84.4 kg)   Blood sugar- He does not measure his blood sugar at home. He is managing his blood sugar with his diet. He continues taking 500 mg metformin daily PO and reports no new issues while taking it.  Lab Results  Component Value Date   HGBA1C 6.3 (H) 02/26/2021   Blood pressure- His blood pressure is doing well during this visit. He continues taking 10 mg lisinopril daily PO and reports no new issues while taking it.  BP Readings from Last 3 Encounters:  01/31/22 129/78  02/26/21 118/82  05/25/20 137/81   Pulse Readings from Last 3 Encounters:  01/31/22 63  02/26/21 66  05/25/20 63   Cholesterol- He continues taking 10 mg atorvastatin daily PO, 145 mg fenofibrate daily PO, Fish oil supplements and reports no new issues while taking them.  Lab Results  Component Value Date   CHOL 143 02/26/2021   HDL 38 (L) 02/26/2021   LDLCALC 67 02/26/2021   LDLDIRECT 77.0 06/28/2019   TRIG 319 (H) 02/26/2021   CHOLHDL 3.8 02/26/2021   Vision- He is UTD on vision care.   Omeprazole- He continues taking OTC omeprazole and takes it 2-3 times every week.   Immunizations- He is due for a tetanus vaccine and is not interested in receiving it at this time.    Health Maintenance Due  Topic Date Due    OPHTHALMOLOGY EXAM  04/08/2021   HEMOGLOBIN A1C  08/29/2021    Past Medical History:  Diagnosis Date   Chicken pox    Diabetes type 2, uncontrolled (HCC) 01/27/2014   Fatty liver    Heartburn    History of chicken pox    Hypertension     Past Surgical History:  Procedure Laterality Date   KNEE SURGERY  age 14   bone spur? left knee    Family History  Problem Relation Age of Onset   Diabetes Maternal Grandmother    Heart disease Maternal Grandmother        CABG x 4   Diabetes Paternal Grandfather    Kidney disease Paternal Grandfather        kidney failure due to diabetes?   Diabetes Mellitus II Mother        "borderline"   Sleep apnea Father    Diabetes Maternal Aunt    Heart disease Maternal Uncle    Cancer Paternal Uncle    Diabetes Paternal Uncle    Arrhythmia Maternal Grandfather     Social History   Socioeconomic History   Marital status: Married    Spouse name: Not on file   Number of children: 1   Years of education: Not on file   Highest education level:    Subjective:   By signing my name below, I, Shehryar Baig, attest that this documentation has been prepared under the direction and in the presence of O'Sullivan, Lorali Khamis, NP 01/31/2022    Patient ID: Mario Ibarra, male    DOB: 11/02/1980, 41 y.o.   MRN: [id number]  Chief Complaint  Patient presents with   Diabetes    Here for follow up    Diabetes  Patient is in today for a follow up visit.   Refills- He is requesting a refill on 10 mg atorvastatin, 145 mg fenofibrate, 10 mg lisinopril, 500 mg metformin, 20 mg omeprazole.  Weight- His weight is stable during this visit.  Wt Readings from Last 3 Encounters:  01/31/22 177 lb (80.3 kg)  02/26/21 179 lb (81.2 kg)  05/25/20 186 lb (84.4 kg)   Blood sugar- He does not measure his blood sugar at home. He is managing his blood sugar with his diet. He continues taking 500 mg metformin daily PO and reports no new issues while taking it.  Lab Results  Component Value Date   HGBA1C 6.3 (H) 02/26/2021   Blood pressure- His blood pressure is doing well during this visit. He continues taking 10 mg lisinopril daily PO and reports no new issues while taking it.  BP Readings from Last 3 Encounters:  01/31/22 129/78  02/26/21 118/82  05/25/20 137/81   Pulse Readings from Last 3 Encounters:  01/31/22 63  02/26/21 66  05/25/20 63   Cholesterol- He continues taking 10 mg atorvastatin daily PO, 145 mg fenofibrate daily PO, Fish oil supplements and reports no new issues while taking them.  Lab Results  Component Value Date   CHOL 143 02/26/2021   HDL 38 (L) 02/26/2021   LDLCALC 67 02/26/2021   LDLDIRECT 77.0 06/28/2019   TRIG 319 (H) 02/26/2021   CHOLHDL 3.8 02/26/2021   Vision- He is UTD on vision care.   Omeprazole- He continues taking OTC omeprazole and takes it 2-3 times every week.   Immunizations- He is due for a tetanus vaccine and is not interested in receiving it at this time.    Health Maintenance Due  Topic Date Due    OPHTHALMOLOGY EXAM  04/08/2021   HEMOGLOBIN A1C  08/29/2021    Past Medical History:  Diagnosis Date   Chicken pox    Diabetes type 2, uncontrolled (HCC) 01/27/2014   Fatty liver    Heartburn    History of chicken pox    Hypertension     Past Surgical History:  Procedure Laterality Date   KNEE SURGERY  age 14   bone spur? left knee    Family History  Problem Relation Age of Onset   Diabetes Maternal Grandmother    Heart disease Maternal Grandmother        CABG x 4   Diabetes Paternal Grandfather    Kidney disease Paternal Grandfather        kidney failure due to diabetes?   Diabetes Mellitus II Mother        "borderline"   Sleep apnea Father    Diabetes Maternal Aunt    Heart disease Maternal Uncle    Cancer Paternal Uncle    Diabetes Paternal Uncle    Arrhythmia Maternal Grandfather     Social History   Socioeconomic History   Marital status: Married    Spouse name: Not on file   Number of children: 1   Years of education: Not on file   Highest education level:    Subjective:   By signing my name below, I, Shehryar Baig, attest that this documentation has been prepared under the direction and in the presence of O'Sullivan, Nyasha Rahilly, NP 01/31/2022    Patient ID: Mario Ibarra, male    DOB: 03/01/1981, 41 y.o.   MRN: [id number]  Chief Complaint  Patient presents with   Diabetes    Here for follow up    Diabetes  Patient is in today for a follow up visit.   Refills- He is requesting a refill on 10 mg atorvastatin, 145 mg fenofibrate, 10 mg lisinopril, 500 mg metformin, 20 mg omeprazole.  Weight- His weight is stable during this visit.  Wt Readings from Last 3 Encounters:  01/31/22 177 lb (80.3 kg)  02/26/21 179 lb (81.2 kg)  05/25/20 186 lb (84.4 kg)   Blood sugar- He does not measure his blood sugar at home. He is managing his blood sugar with his diet. He continues taking 500 mg metformin daily PO and reports no new issues while taking it.  Lab Results  Component Value Date   HGBA1C 6.3 (H) 02/26/2021   Blood pressure- His blood pressure is doing well during this visit. He continues taking 10 mg lisinopril daily PO and reports no new issues while taking it.  BP Readings from Last 3 Encounters:  01/31/22 129/78  02/26/21 118/82  05/25/20 137/81   Pulse Readings from Last 3 Encounters:  01/31/22 63  02/26/21 66  05/25/20 63   Cholesterol- He continues taking 10 mg atorvastatin daily PO, 145 mg fenofibrate daily PO, Fish oil supplements and reports no new issues while taking them.  Lab Results  Component Value Date   CHOL 143 02/26/2021   HDL 38 (L) 02/26/2021   LDLCALC 67 02/26/2021   LDLDIRECT 77.0 06/28/2019   TRIG 319 (H) 02/26/2021   CHOLHDL 3.8 02/26/2021   Vision- He is UTD on vision care.   Omeprazole- He continues taking OTC omeprazole and takes it 2-3 times every week.   Immunizations- He is due for a tetanus vaccine and is not interested in receiving it at this time.    Health Maintenance Due  Topic Date Due    OPHTHALMOLOGY EXAM  04/08/2021   HEMOGLOBIN A1C  08/29/2021    Past Medical History:  Diagnosis Date   Chicken pox    Diabetes type 2, uncontrolled (HCC) 01/27/2014   Fatty liver    Heartburn    History of chicken pox    Hypertension     Past Surgical History:  Procedure Laterality Date   KNEE SURGERY  age 14   bone spur? left knee    Family History  Problem Relation Age of Onset   Diabetes Maternal Grandmother    Heart disease Maternal Grandmother        CABG x 4   Diabetes Paternal Grandfather    Kidney disease Paternal Grandfather        kidney failure due to diabetes?   Diabetes Mellitus II Mother        "borderline"   Sleep apnea Father    Diabetes Maternal Aunt    Heart disease Maternal Uncle    Cancer Paternal Uncle    Diabetes Paternal Uncle    Arrhythmia Maternal Grandfather     Social History   Socioeconomic History   Marital status: Married    Spouse name: Not on file   Number of children: 1   Years of education: Not on file   Highest education level:    Subjective:   By signing my name below, I, Shehryar Baig, attest that this documentation has been prepared under the direction and in the presence of O'Sullivan, Geneve Kimpel, NP 01/31/2022    Patient ID: Mario Ibarra, male    DOB: 03/01/1981, 41 y.o.   MRN: [id number]  Chief Complaint  Patient presents with   Diabetes    Here for follow up    Diabetes  Patient is in today for a follow up visit.   Refills- He is requesting a refill on 10 mg atorvastatin, 145 mg fenofibrate, 10 mg lisinopril, 500 mg metformin, 20 mg omeprazole.  Weight- His weight is stable during this visit.  Wt Readings from Last 3 Encounters:  01/31/22 177 lb (80.3 kg)  02/26/21 179 lb (81.2 kg)  05/25/20 186 lb (84.4 kg)   Blood sugar- He does not measure his blood sugar at home. He is managing his blood sugar with his diet. He continues taking 500 mg metformin daily PO and reports no new issues while taking it.  Lab Results  Component Value Date   HGBA1C 6.3 (H) 02/26/2021   Blood pressure- His blood pressure is doing well during this visit. He continues taking 10 mg lisinopril daily PO and reports no new issues while taking it.  BP Readings from Last 3 Encounters:  01/31/22 129/78  02/26/21 118/82  05/25/20 137/81   Pulse Readings from Last 3 Encounters:  01/31/22 63  02/26/21 66  05/25/20 63   Cholesterol- He continues taking 10 mg atorvastatin daily PO, 145 mg fenofibrate daily PO, Fish oil supplements and reports no new issues while taking them.  Lab Results  Component Value Date   CHOL 143 02/26/2021   HDL 38 (L) 02/26/2021   LDLCALC 67 02/26/2021   LDLDIRECT 77.0 06/28/2019   TRIG 319 (H) 02/26/2021   CHOLHDL 3.8 02/26/2021   Vision- He is UTD on vision care.   Omeprazole- He continues taking OTC omeprazole and takes it 2-3 times every week.   Immunizations- He is due for a tetanus vaccine and is not interested in receiving it at this time.    Health Maintenance Due  Topic Date Due    OPHTHALMOLOGY EXAM  04/08/2021   HEMOGLOBIN A1C  08/29/2021    Past Medical History:  Diagnosis Date   Chicken pox    Diabetes type 2, uncontrolled (HCC) 01/27/2014   Fatty liver    Heartburn    History of chicken pox    Hypertension     Past Surgical History:  Procedure Laterality Date   KNEE SURGERY  age 14   bone spur? left knee    Family History  Problem Relation Age of Onset   Diabetes Maternal Grandmother    Heart disease Maternal Grandmother        CABG x 4   Diabetes Paternal Grandfather    Kidney disease Paternal Grandfather        kidney failure due to diabetes?   Diabetes Mellitus II Mother        "borderline"   Sleep apnea Father    Diabetes Maternal Aunt    Heart disease Maternal Uncle    Cancer Paternal Uncle    Diabetes Paternal Uncle    Arrhythmia Maternal Grandfather     Social History   Socioeconomic History   Marital status: Married    Spouse name: Not on file   Number of children: 1   Years of education: Not on file   Highest education level:

## 2022-01-31 NOTE — Telephone Encounter (Signed)
Please call [facility] and request DM eye exam.

## 2022-01-31 NOTE — Assessment & Plan Note (Addendum)
Lab Results  Component Value Date   HGBA1C 6.3 (H) 02/26/2021   HGBA1C 6.5 (H) 05/25/2020   HGBA1C 6.5 06/28/2019   Lab Results  Component Value Date   MICROALBUR 2.3 (H) 12/19/2017   LDLCALC 67 02/26/2021   CREATININE 0.91 02/26/2021   Wt Readings from Last 3 Encounters:  01/31/22 177 lb (80.3 kg)  02/26/21 179 lb (81.2 kg)  05/25/20 186 lb (84.4 kg)   Clinically stable. Continue metformin. Continue ACE for microalbuminuria. Will request DM eye exam.

## 2022-01-31 NOTE — Assessment & Plan Note (Addendum)
Lab Results  Component Value Date   ALT 77 (H) 02/26/2021   AST 30 02/26/2021   ALKPHOS 103 06/28/2019   BILITOT 0.5 02/26/2021   Repeat LFT's.

## 2022-01-31 NOTE — Assessment & Plan Note (Addendum)
BP Readings from Last 3 Encounters:  01/31/22 129/78  02/26/21 118/82  05/25/20 137/81   BP stable on lisinopril 10mg .

## 2022-07-11 LAB — HM DIABETES EYE EXAM

## 2022-08-10 ENCOUNTER — Other Ambulatory Visit: Payer: Self-pay | Admitting: Family

## 2022-08-10 NOTE — Telephone Encounter (Signed)
Please contact pt to schedule follow up.  

## 2022-08-30 ENCOUNTER — Ambulatory Visit: Payer: Self-pay | Admitting: Family

## 2022-09-10 ENCOUNTER — Other Ambulatory Visit: Payer: Self-pay | Admitting: Family

## 2022-09-11 NOTE — Telephone Encounter (Signed)
Please contact pt to schedule follow up.  

## 2022-09-12 ENCOUNTER — Other Ambulatory Visit: Payer: Self-pay | Admitting: Family

## 2022-09-21 ENCOUNTER — Other Ambulatory Visit: Payer: Self-pay | Admitting: Family

## 2022-10-10 ENCOUNTER — Ambulatory Visit: Payer: Managed Care, Other (non HMO) | Admitting: Family

## 2022-10-10 ENCOUNTER — Encounter: Payer: Self-pay | Admitting: Family

## 2022-10-10 VITALS — BP 122/71 | HR 61 | Temp 97.9°F | Resp 16 | Ht 69.3 in | Wt 183.0 lb

## 2022-10-10 DIAGNOSIS — I10 Essential (primary) hypertension: Secondary | ICD-10-CM | POA: Diagnosis not present

## 2022-10-10 DIAGNOSIS — K219 Gastro-esophageal reflux disease without esophagitis: Secondary | ICD-10-CM | POA: Diagnosis not present

## 2022-10-10 DIAGNOSIS — E785 Hyperlipidemia, unspecified: Secondary | ICD-10-CM

## 2022-10-10 DIAGNOSIS — E119 Type 2 diabetes mellitus without complications: Secondary | ICD-10-CM | POA: Diagnosis not present

## 2022-10-10 LAB — COMPREHENSIVE METABOLIC PANEL
ALT: 52 U/L (ref 0–53)
AST: 31 U/L (ref 0–37)
Albumin: 4.5 g/dL (ref 3.5–5.2)
Alkaline Phosphatase: 49 U/L (ref 39–117)
BUN: 15 mg/dL (ref 6–23)
CO2: 26 mEq/L (ref 19–32)
Calcium: 9.6 mg/dL (ref 8.4–10.5)
Chloride: 98 mEq/L (ref 96–112)
Creatinine, Ser: 1.14 mg/dL (ref 0.40–1.50)
GFR: 79.02 mL/min (ref >60.00)
Glucose, Bld: 136 mg/dL — ABNORMAL HIGH (ref 70–99)
Potassium: 4.5 mEq/L (ref 3.5–5.1)
Sodium: 133 mEq/L — ABNORMAL LOW (ref 135–145)
Total Bilirubin: 0.5 mg/dL (ref 0.2–1.2)
Total Protein: 6.9 g/dL (ref 6.0–8.3)

## 2022-10-10 LAB — LIPID PANEL
Cholesterol: 146 mg/dL (ref 0–200)
HDL: 33.5 mg/dL — ABNORMAL LOW (ref >39.00)
LDL Cholesterol: 81 mg/dL (ref 0–99)
NonHDL: 112.82
Total CHOL/HDL Ratio: 4
Triglycerides: 157 mg/dL — ABNORMAL HIGH (ref 0.0–149.0)
VLDL: 31.4 mg/dL (ref 0.0–40.0)

## 2022-10-10 LAB — HEMOGLOBIN A1C: Hgb A1c MFr Bld: 6.9 % — ABNORMAL HIGH (ref 4.6–6.5)

## 2022-10-10 MED ORDER — OMEPRAZOLE 20 MG PO CPDR: 20.0000 mg | DELAYED_RELEASE_CAPSULE | Freq: Every day | ORAL | 1 refills | Status: DC | PRN

## 2022-10-10 MED ORDER — METFORMIN HCL 500 MG PO TABS: 500.0000 mg | ORAL_TABLET | Freq: Two times a day (BID) | ORAL | 1 refills | Status: DC

## 2022-10-10 MED ORDER — FENOFIBRATE 145 MG PO TABS: 145.0000 mg | ORAL_TABLET | Freq: Every day | ORAL | 1 refills | Status: DC

## 2022-10-10 NOTE — Assessment & Plan Note (Signed)
BP Readings from Last 3 Encounters:  10/10/22 122/71  01/31/22 129/78  02/26/21 118/82   BP stable, continue lisinopril 40m.

## 2022-10-10 NOTE — Assessment & Plan Note (Signed)
Clinically stable. Last A1C at goal. Obtain follow up A1C.

## 2022-10-10 NOTE — Assessment & Plan Note (Signed)
Stable with QOD omeprazole otc. Continue same.

## 2022-10-10 NOTE — Assessment & Plan Note (Signed)
LDL at goal last check. Continue atorvastatin fenofibrate.

## 2022-10-10 NOTE — Progress Notes (Signed)
Subjective:     Patient ID: Mario Ibarra, male    DOB: December 22, 1980, 42 y.o.   MRN: [id number]  Chief Complaint  Patient presents with   Diabetes    Here for follow up    Diabetes    DM2- continues metformin bid.  Lab Results  Component Value Date   HGBA1C 6.2 01/31/2022   HGBA1C 6.3 (H) 02/26/2021   HGBA1C 6.5 (H) 05/25/2020   Lab Results  Component Value Date   MICROALBUR <0.7 01/31/2022   LDLCALC 71 01/31/2022   CREATININE 1.08 01/31/2022   Hyperlipidemia- maintained on fenofibrate and atorvastatin and fish oil.  Lab Results  Component Value Date   CHOL 135 01/31/2022   HDL 47.10 01/31/2022   LDLCALC 71 01/31/2022   LDLDIRECT 77.0 06/28/2019   TRIG 83.0 01/31/2022   CHOLHDL 3 01/31/2022   GERD-  reports gerd symptoms are ok with QOD otc omeprazole. Continue same.    Health Maintenance Due  Topic Date Due   HEMOGLOBIN A1C  08/02/2022    Past Medical History:  Diagnosis Date   Chicken pox    Diabetes type 2, uncontrolled 01/27/2014   Fatty liver    Heartburn    History of chicken pox    Hypertension     Past Surgical History:  Procedure Laterality Date   KNEE SURGERY  age 37   bone spur? left knee    Family History  Problem Relation Age of Onset   Diabetes Maternal Grandmother    Heart disease Maternal Grandmother        CABG x 4   Diabetes Paternal Grandfather    Kidney disease Paternal Grandfather        kidney failure due to diabetes?   Diabetes Mellitus II Mother        "borderline"   Sleep apnea Father    Diabetes Maternal Aunt    Heart disease Maternal Uncle    Cancer Paternal Uncle    Diabetes Paternal Uncle    Arrhythmia Maternal Grandfather     Social History   Socioeconomic History   Marital status: Married    Spouse name: Not on file   Number of children: 1   Years of education: Not on file   Highest education level: Not on file  Occupational History   Occupation: Heritage manager: Production assistant, radio FOR SELF EMPLOYED     Comment: [employer]  Tobacco Use   Smoking status: Never   Smokeless tobacco: Never  Substance and Sexual Activity   Alcohol use: Yes    Alcohol/week: 0.0 - 1.0 standard drinks of alcohol   Drug use: No   Sexual activity: Yes    Birth control/protection: None  Other Topics Concern   Not on file  Social History Narrative   Married to [place]. 2 children [place] and [place].   Some college. 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   Drinks caffeinated beverages. Takes a daily vitamin.   Wears his seatbelt, smoke detector in the home.    Regular exercise: no   Social Determinants of Corporate investment banker Strain: Not on file  Food Insecurity: Not on file  Transportation Needs: Not on file  Physical Activity: Not on file  Stress: Not on file  Social Connections: Not on file  Intimate Partner Violence: Not on file    Outpatient Medications Prior to Visit  Medication Sig Dispense Refill   aspirin EC 81 MG tablet Take 1 tablet (81 mg total)  by mouth daily.     atorvastatin (LIPITOR) 10 MG tablet TAKE 1 TABLET BY MOUTH EVERY DAY 90 tablet 1   glucose blood (ACCU-CHEK ACTIVE STRIPS) test strip Use as instructed 100 each 12   Lancets (ACCU-CHEK MULTICLIX) lancets Use as instructed 100 each 12   lisinopril (ZESTRIL) 10 MG tablet TAKE 1 TABLET BY MOUTH EVERY DAY 90 tablet 1   Omega-3 Fatty Acids (FISH OIL) 1000 MG CAPS Take 2,000 mg by mouth 2 (two) times daily. Reported on 11/20/2015     fenofibrate (TRICOR) 145 MG tablet TAKE 1 TABLET BY MOUTH EVERY DAY 30 tablet 0   metFORMIN (GLUCOPHAGE) 500 MG tablet TAKE 1 TABLET BY MOUTH 2 TIMES DAILY WITH A MEAL. 60 tablet 0   omeprazole (PRILOSEC) 20 MG capsule Take 1 capsule (20 mg total) by mouth daily as needed. 90 capsule 1   No facility-administered medications prior to visit.    No Known Allergies  ROS    See HPI Objective:    Physical Exam Constitutional:      General: He is not in acute distress.    Appearance: He is  well-developed.  HENT:     Head: Normocephalic and atraumatic.  Cardiovascular:     Rate and Rhythm: Normal rate and regular rhythm.     Heart sounds: No murmur heard. Pulmonary:     Effort: Pulmonary effort is normal. No respiratory distress.     Breath sounds: Normal breath sounds. No wheezing or rales.  Skin:    General: Skin is warm and dry.  Neurological:     Mental Status: He is alert and oriented to person, place, and time.  Psychiatric:        Behavior: Behavior normal.        Thought Content: Thought content normal.     BP 122/71 (BP Location: Right Arm, Patient Position: Sitting, Cuff Size: Small)   Pulse 61   Temp 97.9 F (36.6 C) (Oral)   Resp 16   Ht 5' 9.3" (1.76 m)   Wt 183 lb (83 kg)   SpO2 100%   BMI 26.79 kg/m  Wt Readings from Last 3 Encounters:  10/10/22 183 lb (83 kg)  01/31/22 177 lb (80.3 kg)  02/26/21 179 lb (81.2 kg)       Assessment & Plan:   Problem List Items Addressed This Visit       Unprioritized   Hyperlipidemia    LDL at goal last check. Continue atorvastatin fenofibrate.       Relevant Medications   fenofibrate (TRICOR) 145 MG tablet   Other Relevant Orders   Lipid panel   HTN (hypertension)    BP Readings from Last 3 Encounters:  10/10/22 122/71  01/31/22 129/78  02/26/21 118/82  BP stable, continue lisinopril 10mg .        Relevant Medications   fenofibrate (TRICOR) 145 MG tablet   GERD (gastroesophageal reflux disease)    Stable with QOD omeprazole otc. Continue same.       Relevant Medications   omeprazole (PRILOSEC) 20 MG capsule   Other Visit Diagnoses     Controlled type 2 diabetes mellitus without complication, without long-term current use of insulin (HCC)    -  Primary   Relevant Medications   metFORMIN (GLUCOPHAGE) 500 MG tablet   Other Relevant Orders   HgB A1c   Comp Met (CMET)       I have changed Mario Ibarra's metFORMIN and fenofibrate. I am also having him  maintain his Fish Oil, aspirin  EC, accu-chek multiclix, glucose blood, lisinopril, atorvastatin, and omeprazole.  Meds ordered this encounter  Medications   omeprazole (PRILOSEC) 20 MG capsule    Sig: Take 1 capsule (20 mg total) by mouth daily as needed.    Dispense:  90 capsule    Refill:  1    Order Specific Question:   Supervising Provider    Answer:   Danise Edge A [[id number]]   metFORMIN (GLUCOPHAGE) 500 MG tablet    Sig: Take 1 tablet (500 mg total) by mouth 2 (two) times daily with a meal.    Dispense:  180 tablet    Refill:  1    Order Specific Question:   Supervising Provider    Answer:   Danise Edge A [[id number]]   fenofibrate (TRICOR) 145 MG tablet    Sig: Take 1 tablet (145 mg total) by mouth daily.    Dispense:  90 tablet    Refill:  1    Order Specific Question:   Supervising Provider    Answer:   Danise Edge A [[id number]]

## 2023-03-22 ENCOUNTER — Other Ambulatory Visit: Payer: Self-pay | Admitting: Family

## 2023-04-13 ENCOUNTER — Other Ambulatory Visit: Payer: Self-pay | Admitting: Family

## 2023-04-13 NOTE — Telephone Encounter (Signed)
I sent a 30 day supply of his medication, however he is overdue for follow up.  Please contact pt to schedule.

## 2023-04-14 NOTE — Telephone Encounter (Signed)
Lvm2 sched  

## 2023-05-13 ENCOUNTER — Other Ambulatory Visit: Payer: Self-pay | Admitting: Family

## 2023-06-17 ENCOUNTER — Other Ambulatory Visit: Payer: Self-pay | Admitting: Family

## 2023-06-18 NOTE — Telephone Encounter (Signed)
Please contact pt to schedule follow up.  

## 2023-06-21 ENCOUNTER — Encounter: Payer: Self-pay | Admitting: Family

## 2023-06-27 ENCOUNTER — Ambulatory Visit: Payer: Managed Care, Other (non HMO) | Admitting: Family

## 2023-06-27 VITALS — BP 125/66 | HR 65 | Temp 98.4°F | Resp 16 | Ht 69.0 in | Wt 183.0 lb

## 2023-06-27 DIAGNOSIS — E785 Hyperlipidemia, unspecified: Secondary | ICD-10-CM

## 2023-06-27 DIAGNOSIS — Z7984 Long term (current) use of oral hypoglycemic drugs: Secondary | ICD-10-CM

## 2023-06-27 DIAGNOSIS — R809 Proteinuria, unspecified: Secondary | ICD-10-CM

## 2023-06-27 DIAGNOSIS — I10 Essential (primary) hypertension: Secondary | ICD-10-CM

## 2023-06-27 DIAGNOSIS — E1129 Type 2 diabetes mellitus with other diabetic kidney complication: Secondary | ICD-10-CM

## 2023-06-27 DIAGNOSIS — K219 Gastro-esophageal reflux disease without esophagitis: Secondary | ICD-10-CM

## 2023-06-27 LAB — MICROALBUMIN / CREATININE URINE RATIO
Creatinine,U: 75.1 mg/dL
Microalb Creat Ratio: 0.9 mg/g (ref 0.0–30.0)
Microalb, Ur: <0.7 mg/dL (ref 0.0–1.9)

## 2023-06-27 LAB — HEMOGLOBIN A1C: Hgb A1c MFr Bld: 6.4 % (ref 4.6–6.5)

## 2023-06-27 LAB — COMPREHENSIVE METABOLIC PANEL
ALT: 54 U/L — ABNORMAL HIGH (ref 0–53)
AST: 24 U/L (ref 0–37)
Albumin: 4.5 g/dL (ref 3.5–5.2)
Alkaline Phosphatase: 86 U/L (ref 39–117)
BUN: 17 mg/dL (ref 6–23)
CO2: 27 meq/L (ref 19–32)
Calcium: 9.5 mg/dL (ref 8.4–10.5)
Chloride: 100 meq/L (ref 96–112)
Creatinine, Ser: 0.94 mg/dL (ref 0.40–1.50)
GFR: 99.1 mL/min (ref >60.00)
Glucose, Bld: 164 mg/dL — ABNORMAL HIGH (ref 70–99)
Potassium: 4.4 meq/L (ref 3.5–5.1)
Sodium: 136 meq/L (ref 135–145)
Total Bilirubin: 0.4 mg/dL (ref 0.2–1.2)
Total Protein: 6.9 g/dL (ref 6.0–8.3)

## 2023-06-27 MED ORDER — LISINOPRIL 10 MG PO TABS: 10.0000 mg | ORAL_TABLET | Freq: Every day | ORAL | 1 refills | Status: DC

## 2023-06-27 MED ORDER — METFORMIN HCL 500 MG PO TABS
500.0000 mg | ORAL_TABLET | Freq: Two times a day (BID) | ORAL | 1 refills | Status: DC
Start: 2023-06-27 — End: 2023-12-22

## 2023-06-27 MED ORDER — FENOFIBRATE 145 MG PO TABS: 145.0000 mg | ORAL_TABLET | Freq: Every day | ORAL | 1 refills | Status: DC

## 2023-06-27 MED ORDER — OMEPRAZOLE 20 MG PO CPDR: 20.0000 mg | DELAYED_RELEASE_CAPSULE | Freq: Every day | ORAL | 1 refills | Status: AC | PRN

## 2023-06-27 MED ORDER — ATORVASTATIN CALCIUM 10 MG PO TABS: 10.0000 mg | ORAL_TABLET | Freq: Every day | ORAL | 1 refills | Status: DC

## 2023-06-27 NOTE — Assessment & Plan Note (Signed)
Stable. Continue on Prilosec has episodes if he eats spicy foods

## 2023-06-27 NOTE — Patient Instructions (Signed)
VISIT SUMMARY:  Today, we reviewed your current health status and refilled your medications. We discussed your diabetes management, cholesterol levels, and blood pressure control. Additionally, we addressed your concerns about numbness in your feet and provided guidance on heart-healthy eating. Routine labs were ordered, and we confirmed your upcoming eye exam.  YOUR PLAN:  -TYPE 2 DIABETES MELLITUS: Type 2 Diabetes Mellitus is a condition where your body does not use insulin properly, leading to high blood sugar levels. Your last A1c was 6.9, and our goal is to keep it under 7. You are currently taking Metformin twice daily. We will check your A1c, cholesterol, kidney function, and urine today. A heart-healthy eating plan has been provided for your reference. We will follow up in 3-4 months to monitor your A1c.  -HYPERLIPIDEMIA: Hyperlipidemia is a condition where you have high levels of fats (lipids) in your blood, which can increase your risk of heart disease. You have been off Fenofibrate for about 3 weeks, and we have refilled it for 90 days. We discussed the benefits and risks of statin therapy, emphasizing its importance in reducing the risk of heart attack and stroke, especially in patients with diabetes. We will monitor your cholesterol levels.  -HYPERTENSION: Hypertension is high blood pressure, which can lead to serious health problems if not managed properly. Your blood pressure is well controlled with Lisinopril, which we have refilled for 90 days. Continue taking Lisinopril as prescribed.  -GENERAL HEALTH MAINTENANCE: We performed a foot exam today and noted your upcoming eye exam in mid-November. It was confirmed that you have not received the COVID vaccine. Routine labs were ordered to check your overall health, including A1c, cholesterol, kidney function, and urine. A heart-healthy eating plan was provided for your reference.  INSTRUCTIONS:  Please follow up in 3-4 months to monitor  your A1c levels. Ensure you attend your eye exam in mid-November. Continue taking your medications as prescribed and follow the heart-healthy eating plan provided. If you have any concerns or experience any new symptoms, please contact our office.

## 2023-06-27 NOTE — Assessment & Plan Note (Addendum)
Continue on lipitor and refill Fenofibrate.   Update lipid panel.  Pt given handout on heart healthy diet.

## 2023-06-27 NOTE — Assessment & Plan Note (Signed)
Stable. Continue on Lisinopril

## 2023-06-27 NOTE — Progress Notes (Signed)
Subjective:     Patient ID: Mario Ibarra, male    DOB: [birth year], 42 y.o.   MRN: [id number]  Chief Complaint  Patient presents with   Diabetes    Here for follow up   Hyperlipidemia    Here for folllow up    HPI  Discussed the use of AI scribe software for clinical note transcription with the patient, who gave verbal consent to proceed.   Patient is a 42 year old male with a history of hypertension, diabetes type 2 that is controlled without long term use of insulin, and hyperlipidemia. Patient states that he really does not exercise as much as he should. He states diet he is trying to work on but he is asking for information on healthy diet eating. Gave information for DASH diet.   Patient is on metformin for blood sugar. No insulin required at this time. Does not check blood sugars at home. A1C was noted to be a little above range 8 months ago at 6.9. He states that he is trying to cut down on sweets and salty foods but it is a challenge.   He states for the past 3 weeks he has been out of Fenofibrate. Educated him on the importance of taking medication in regards to cholesterol levels.      Health Maintenance Due  Topic Date Due   Diabetic kidney evaluation - Urine ACR  02/01/2023   FOOT EXAM  02/01/2023   HEMOGLOBIN A1C  04/10/2023   COVID-19 Vaccine (1 - 2023-24 season) Never done    Past Medical History:  Diagnosis Date   Chicken pox    Diabetes type 2, uncontrolled 01/27/2014   Fatty liver    Heartburn    History of chicken pox    Hypertension     Past Surgical History:  Procedure Laterality Date   KNEE SURGERY  age 70   bone spur? left knee    Family History  Problem Relation Age of Onset   Diabetes Maternal Grandmother    Heart disease Maternal Grandmother        CABG x 4   Diabetes Paternal Grandfather    Kidney disease Paternal Grandfather        kidney failure due to diabetes?   Diabetes Mellitus II Mother        "borderline"   Sleep apnea  Father    Diabetes Maternal Aunt    Heart disease Maternal Uncle    Cancer Paternal Uncle    Diabetes Paternal Uncle    Arrhythmia Maternal Grandfather     Social History   Socioeconomic History   Marital status: Married    Spouse name: Not on file   Number of children: 1   Years of education: Not on file   Highest education level: Not on file  Occupational History   Occupation: Heritage manager: Production assistant, radio FOR SELF EMPLOYED    Comment: [employer]  Tobacco Use   Smoking status: Never   Smokeless tobacco: Never  Substance and Sexual Activity   Alcohol use: Yes    Alcohol/week: 0.0 - 1.0 standard drinks of alcohol   Drug use: No   Sexual activity: Yes    Birth control/protection: None  Other Topics Concern   Not on file  Social History Narrative   ** Merged History Encounter **       Married to [employer]. 2 children [place] and [place]. Some college. 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 Drinks caffeinated  beverages. Takes a daily vitamin. Wears his seatbelt, smoke detector in the home.  Regular exercise: no    Social Determinants of Corporate investment banker Strain: Not on file  Food Insecurity: Not on file  Transportation Needs: Not on file  Physical Activity: Not on file  Stress: Not on file  Social Connections: Not on file  Intimate Partner Violence: Not on file    Outpatient Medications Prior to Visit  Medication Sig Dispense Refill   aspirin EC 81 MG tablet Take 1 tablet (81 mg total) by mouth daily.     atorvastatin (LIPITOR) 10 MG tablet TAKE 1 TABLET BY MOUTH EVERY DAY 30 tablet 0   glucose blood (ACCU-CHEK ACTIVE STRIPS) test strip Use as instructed 100 each 12   Lancets (ACCU-CHEK MULTICLIX) lancets Use as instructed 100 each 12   lisinopril (ZESTRIL) 10 MG tablet TAKE 1 TABLET BY MOUTH EVERY DAY 30 tablet 0   metFORMIN (GLUCOPHAGE) 500 MG tablet Take 1 tablet (500 mg total) by mouth 2 (two) times daily with a meal. Needs OV prior to additional refills  14 tablet 0   Omega-3 Fatty Acids (FISH OIL) 1000 MG CAPS Take 2,000 mg by mouth 2 (two) times daily. Reported on 11/20/2015     omeprazole (PRILOSEC) 20 MG capsule Take 1 capsule (20 mg total) by mouth daily as needed. 90 capsule 1   fenofibrate (TRICOR) 145 MG tablet Take 1 tablet (145 mg total) by mouth daily. Needs OV prior to additional refills. (Patient not taking: Reported on 06/27/2023) 7 tablet 0   No facility-administered medications prior to visit.    No Known Allergies  Review of Systems  Constitutional:  Negative for chills, fever and malaise/fatigue.  HENT:  Negative for nosebleeds, sore throat and tinnitus.   Eyes:  Negative for pain.  Respiratory:  Negative for cough, shortness of breath, wheezing and stridor.   Cardiovascular:  Negative for orthopnea.  Gastrointestinal:  Negative for diarrhea, nausea and vomiting.  Genitourinary:  Negative for urgency.  Musculoskeletal:  Negative for back pain and neck pain.  Skin:  Negative for itching and rash.  Neurological:  Negative for dizziness and headaches.  Psychiatric/Behavioral:  Negative for depression.        Objective:    Physical Exam   BP 125/66 (BP Location: Right Arm, Patient Position: Sitting, Cuff Size: Normal)   Pulse 65   Temp 98.4 F (36.9 C) (Oral)   Resp 16   Ht 5\' 9"  (1.753 m)   Wt 183 lb (83 kg)   SpO2 98%   BMI 27.02 kg/m  Wt Readings from Last 3 Encounters:  06/27/23 183 lb (83 kg)  10/10/22 183 lb (83 kg)  01/31/22 177 lb (80.3 kg)       Assessment & Plan:   Problem List Items Addressed This Visit   None   I am having Mario Ibarra maintain his Fish Oil, aspirin EC, accu-chek multiclix, glucose blood, omeprazole, fenofibrate, metFORMIN, atorvastatin, and lisinopril.  No orders of the defined types were placed in this encounter.

## 2023-06-27 NOTE — Assessment & Plan Note (Addendum)
Lab Results  Component Value Date   HGBA1C 6.9 (H) 10/10/2022   Continues metformin.  Update A1c.

## 2023-06-27 NOTE — Progress Notes (Signed)
Subjective:     Patient ID: Mario Ibarra, male    DOB: 1981/08/05, 42 y.o.   MRN: [id number]  Chief Complaint  Patient presents with   Diabetes    Here for follow up   Hyperlipidemia    Here for folllow up    Discussed the use of AI scribe software for clinical note transcription with the patient, who gave verbal consent to proceed.  History of Present Illness   The patient, with a history of hypertension, hyperlipidemia, and diabetes, presents for medication refills. He has been out of fenofibrate for approximately three weeks. His last A1c, checked in February, was 6.9, and he continues to take metformin twice daily. He also reports numbness in his big toes and the balls of his feet for the past decade, which he describes as similar to the sensation of a foot falling asleep. The numbness has not worsened over time.          Health Maintenance Due  Topic Date Due   Diabetic kidney evaluation - Urine ACR  02/01/2023   HEMOGLOBIN A1C  04/10/2023    Past Medical History:  Diagnosis Date   Chicken pox    Diabetes type 2, uncontrolled 01/27/2014   Fatty liver    Heartburn    History of chicken pox    Hypertension     Past Surgical History:  Procedure Laterality Date   KNEE SURGERY  age 24   bone spur? left knee    Family History  Problem Relation Age of Onset   Diabetes Maternal Grandmother    Heart disease Maternal Grandmother        CABG x 4   Diabetes Paternal Grandfather    Kidney disease Paternal Grandfather        kidney failure due to diabetes?   Diabetes Mellitus II Mother        "borderline"   Sleep apnea Father    Diabetes Maternal Aunt    Heart disease Maternal Uncle    Cancer Paternal Uncle    Diabetes Paternal Uncle    Arrhythmia Maternal Grandfather     Social History   Socioeconomic History   Marital status: Married    Spouse name: Not on file   Number of children: 1   Years of education: Not on file   Highest education level: Not on  file  Occupational History   Occupation: Heritage manager: Production assistant, radio FOR SELF EMPLOYED    Comment: [employer]  Tobacco Use   Smoking status: Never   Smokeless tobacco: Never  Substance and Sexual Activity   Alcohol use: Yes    Alcohol/week: 0.0 - 1.0 standard drinks of alcohol   Drug use: No   Sexual activity: Yes    Birth control/protection: None  Other Topics Concern   Not on file  Social History Narrative   ** Merged History Encounter **       Married to [employer]. 2 children [place] and [place]. Some college. 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 Drinks caffeinated beverages. Takes a daily vitamin. Wears his seatbelt, smoke detector in the home.  Regular exercise: no    Social Determinants of Corporate investment banker Strain: Not on file  Food Insecurity: Not on file  Transportation Needs: Not on file  Physical Activity: Not on file  Stress: Not on file  Social Connections: Not on file  Intimate Partner Violence: Not on file    Outpatient Medications Prior to Visit  Medication Sig Dispense Refill   aspirin EC 81 MG tablet Take 1 tablet (81 mg total) by mouth daily.     glucose blood (ACCU-CHEK ACTIVE STRIPS) test strip Use as instructed 100 each 12   Lancets (ACCU-CHEK MULTICLIX) lancets Use as instructed 100 each 12   Omega-3 Fatty Acids (FISH OIL) 1000 MG CAPS Take 2,000 mg by mouth 2 (two) times daily. Reported on 11/20/2015     atorvastatin (LIPITOR) 10 MG tablet TAKE 1 TABLET BY MOUTH EVERY DAY 30 tablet 0   lisinopril (ZESTRIL) 10 MG tablet TAKE 1 TABLET BY MOUTH EVERY DAY 30 tablet 0   metFORMIN (GLUCOPHAGE) 500 MG tablet Take 1 tablet (500 mg total) by mouth 2 (two) times daily with a meal. Needs OV prior to additional refills 14 tablet 0   omeprazole (PRILOSEC) 20 MG capsule Take 1 capsule (20 mg total) by mouth daily as needed. 90 capsule 1   fenofibrate (TRICOR) 145 MG tablet Take 1 tablet (145 mg total) by mouth daily. Needs OV prior to additional refills.  (Patient not taking: Reported on 06/27/2023) 7 tablet 0   No facility-administered medications prior to visit.    No Known Allergies  ROS See HPI    Objective:    Physical Exam Constitutional:      General: He is not in acute distress.    Appearance: He is well-developed.  HENT:     Head: Normocephalic and atraumatic.  Cardiovascular:     Rate and Rhythm: Normal rate and regular rhythm.     Heart sounds: No murmur heard. Pulmonary:     Effort: Pulmonary effort is normal. No respiratory distress.     Breath sounds: Normal breath sounds. No wheezing or rales.  Musculoskeletal:        General: No swelling.  Skin:    General: Skin is warm and dry.  Neurological:     Mental Status: He is alert and oriented to person, place, and time.  Psychiatric:        Behavior: Behavior normal.        Thought Content: Thought content normal.    Diabetic Foot Exam - Simple   Simple Foot Form Diabetic Foot exam was performed with the following findings: Yes 06/27/2023  9:41 AM  Visual Inspection No deformities, no ulcerations, no other skin breakdown bilaterally: Yes Sensation Testing Intact to touch and monofilament testing bilaterally: Yes Pulse Check Posterior Tibialis and Dorsalis pulse intact bilaterally: Yes Comments      BP 125/66 (BP Location: Right Arm, Patient Position: Sitting, Cuff Size: Normal)   Pulse 65   Temp 98.4 F (36.9 C) (Oral)   Resp 16   Ht 5\' 9"  (1.753 m)   Wt 183 lb (83 kg)   SpO2 98%   BMI 27.02 kg/m  Wt Readings from Last 3 Encounters:  06/27/23 183 lb (83 kg)  10/10/22 183 lb (83 kg)  01/31/22 177 lb (80.3 kg)       Assessment & Plan:   Problem List Items Addressed This Visit       Unprioritized   Hyperlipidemia    Continue on lipitor and refill Fenofibrate.   Update lipid panel.  Pt given handout on heart healthy diet.       Relevant Medications   lisinopril (ZESTRIL) 10 MG tablet   fenofibrate (TRICOR) 145 MG tablet    atorvastatin (LIPITOR) 10 MG tablet   HTN (hypertension)    Stable. Continue on Lisinopril  Relevant Medications   lisinopril (ZESTRIL) 10 MG tablet   fenofibrate (TRICOR) 145 MG tablet   atorvastatin (LIPITOR) 10 MG tablet   GERD (gastroesophageal reflux disease) - Primary    Stable. 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  Update A1c.        Relevant Medications   metFORMIN (GLUCOPHAGE) 500 MG tablet   lisinopril (ZESTRIL) 10 MG tablet   atorvastatin (LIPITOR) 10 MG tablet   Other Relevant Orders   Urine Microalbumin w/creat. ratio   HgB A1c   Comp Met (CMET)    I have changed Reeves T. Radle's lisinopril and atorvastatin. I am also having him maintain his Fish Oil, aspirin EC, accu-chek multiclix, glucose blood, omeprazole, metFORMIN, and fenofibrate.  Meds ordered this encounter  Medications   omeprazole (PRILOSEC) 20 MG capsule    Sig: Take 1 capsule (20 mg total) by mouth daily as needed.    Dispense:  90 capsule    Refill:  1    Order Specific Question:   Supervising Provider    Answer:   Danise Edge A [[id number]]   metFORMIN (GLUCOPHAGE) 500 MG tablet    Sig: Take 1 tablet (500 mg total) by mouth 2 (two) times daily with a meal. Needs OV prior to additional refills    Dispense:  180 tablet    Refill:  1    Order Specific Question:   Supervising Provider    Answer:   Danise Edge A [[id number]]   lisinopril (ZESTRIL) 10 MG tablet    Sig: Take 1 tablet (10 mg total) by mouth daily.    Dispense:  90 tablet    Refill:  1    Order Specific Question:   Supervising Provider    Answer:   Danise Edge A [[id number]]   fenofibrate (TRICOR) 145 MG tablet    Sig: Take 1 tablet (145 mg total) by mouth  daily. Needs OV prior to additional refills.    Dispense:  90 tablet    Refill:  1    Order Specific Question:   Supervising Provider    Answer:   Danise Edge A [[id number]]   atorvastatin (LIPITOR) 10 MG tablet    Sig: Take 1 tablet (10 mg total) by mouth daily.    Dispense:  90 tablet    Refill:  1    Order Specific Question:   Supervising Provider    Answer:   Danise Edge A [[id number]]

## 2023-07-14 ENCOUNTER — Ambulatory Visit: Payer: Managed Care, Other (non HMO) | Admitting: Family

## 2023-12-22 ENCOUNTER — Other Ambulatory Visit: Payer: Self-pay | Admitting: Family

## 2023-12-22 DIAGNOSIS — R809 Proteinuria, unspecified: Secondary | ICD-10-CM

## 2023-12-22 DIAGNOSIS — E785 Hyperlipidemia, unspecified: Secondary | ICD-10-CM

## 2023-12-22 NOTE — Telephone Encounter (Signed)
 Please contact pt to schedule follow up.

## 2024-01-05 ENCOUNTER — Encounter (HOSPITAL_COMMUNITY): Payer: Self-pay

## 2024-01-09 ENCOUNTER — Other Ambulatory Visit: Payer: Self-pay | Admitting: Family

## 2024-01-09 DIAGNOSIS — E785 Hyperlipidemia, unspecified: Secondary | ICD-10-CM

## 2024-01-09 DIAGNOSIS — I10 Essential (primary) hypertension: Secondary | ICD-10-CM

## 2024-01-11 LAB — HM DIABETES EYE EXAM

## 2024-01-12 ENCOUNTER — Telehealth: Payer: Self-pay | Admitting: Family

## 2024-01-12 ENCOUNTER — Ambulatory Visit: Payer: Self-pay | Admitting: Family

## 2024-01-12 VITALS — BP 125/60 | HR 60 | Temp 97.8°F | Resp 16 | Ht 69.0 in | Wt 173.0 lb

## 2024-01-12 DIAGNOSIS — K219 Gastro-esophageal reflux disease without esophagitis: Secondary | ICD-10-CM

## 2024-01-12 DIAGNOSIS — E1129 Type 2 diabetes mellitus with other diabetic kidney complication: Secondary | ICD-10-CM | POA: Diagnosis not present

## 2024-01-12 DIAGNOSIS — I10 Essential (primary) hypertension: Secondary | ICD-10-CM

## 2024-01-12 DIAGNOSIS — E785 Hyperlipidemia, unspecified: Secondary | ICD-10-CM

## 2024-01-12 DIAGNOSIS — K760 Fatty (change of) liver, not elsewhere classified: Secondary | ICD-10-CM

## 2024-01-12 DIAGNOSIS — Z7984 Long term (current) use of oral hypoglycemic drugs: Secondary | ICD-10-CM

## 2024-01-12 DIAGNOSIS — R809 Proteinuria, unspecified: Secondary | ICD-10-CM | POA: Diagnosis not present

## 2024-01-12 LAB — LIPID PANEL
Cholesterol: 154 mg/dL (ref 0–200)
HDL: 43.7 mg/dL (ref >39.00)
LDL Cholesterol: 85 mg/dL (ref 0–99)
NonHDL: 110.15
Total CHOL/HDL Ratio: 4
Triglycerides: 124 mg/dL (ref 0.0–149.0)
VLDL: 24.8 mg/dL (ref 0.0–40.0)

## 2024-01-12 LAB — COMPREHENSIVE METABOLIC PANEL WITH GFR
ALT: 53 U/L (ref 0–53)
AST: 30 U/L (ref 0–37)
Albumin: 5.1 g/dL (ref 3.5–5.2)
Alkaline Phosphatase: 59 U/L (ref 39–117)
BUN: 16 mg/dL (ref 6–23)
CO2: 26 meq/L (ref 19–32)
Calcium: 9.7 mg/dL (ref 8.4–10.5)
Chloride: 101 meq/L (ref 96–112)
Creatinine, Ser: 1.01 mg/dL (ref 0.40–1.50)
GFR: 90.57 mL/min (ref >60.00)
Glucose, Bld: 135 mg/dL — ABNORMAL HIGH (ref 70–99)
Potassium: 4.5 meq/L (ref 3.5–5.1)
Sodium: 136 meq/L (ref 135–145)
Total Bilirubin: 0.6 mg/dL (ref 0.2–1.2)
Total Protein: 7.8 g/dL (ref 6.0–8.3)

## 2024-01-12 LAB — MICROALBUMIN / CREATININE URINE RATIO
Creatinine,U: 87.5 mg/dL
Microalb Creat Ratio: UNDETERMINED mg/g (ref 0.0–30.0)
Microalb, Ur: <0.7 mg/dL

## 2024-01-12 LAB — HEMOGLOBIN A1C: Hgb A1c MFr Bld: 6.7 % — ABNORMAL HIGH (ref 4.6–6.5)

## 2024-01-12 NOTE — Assessment & Plan Note (Signed)
 Lab Results  Component Value Date   CHOL 146 10/10/2022   HDL 33.50 (L) 10/10/2022   LDLCALC 81 10/10/2022   LDLDIRECT 77.0 06/28/2019   TRIG 157.0 (H) 10/10/2022   CHOLHDL 4 10/10/2022  Update lipitor and fenofibrate .

## 2024-01-12 NOTE — Telephone Encounter (Signed)
 Please call [facility]  and request DM eye.

## 2024-01-12 NOTE — Assessment & Plan Note (Addendum)
 Diet is improved.   Lab Results  Component Value Date   HGBA1C 6.4 06/27/2023   HGBA1C 6.9 (H) 10/10/2022   HGBA1C 6.2 01/31/2022   Lab Results  Component Value Date   MICROALBUR <0.7 06/27/2023   LDLCALC 81 10/10/2022   CREATININE 0.94 06/27/2023   Diabetes well-controlled, A1c 6.4. Adhering to diet, 10-pound weight loss. - Continue metformin  500 mg twice daily. - Update A1c today.

## 2024-01-12 NOTE — Telephone Encounter (Signed)
 Electronic request made

## 2024-01-12 NOTE — Assessment & Plan Note (Signed)
 Hopefully with his diet change and weight loss his LFT's will be improved.

## 2024-01-12 NOTE — Assessment & Plan Note (Signed)
 BP Readings from Last 3 Encounters:  01/12/24 125/60  06/27/23 125/66  10/10/22 122/71   At goal on lisinopril .

## 2024-01-12 NOTE — Assessment & Plan Note (Signed)
 Stable with every other day prilosec OTC.

## 2024-01-12 NOTE — Progress Notes (Signed)
 Subjective:     Patient ID: Mario Ibarra, male    DOB: 08-06-1981, 43 y.o.   MRN: [id number]  Chief Complaint  Patient presents with   Diabetes    Here for follow up    Diabetes    Discussed the use of AI scribe software for clinical note transcription with the patient, who gave verbal consent to proceed.  History of Present Illness   Mario Ibarra is a 43 year old male with type 2 diabetes and hypertension who presents for a follow-up on labs and medications.  He manages his diabetes with metformin  500 mg twice daily and follows a diabetic diet with reduced carbohydrate intake. He has lost 10 pounds since his last visit, now weighing 173 pounds. He consumes more protein, including fish, red meat, and eggs. No new or worsening symptoms related to diabetes.  He is on a low dose of lisinopril  for hypertension, with blood pressure readings around 125/60 mmHg, lower than usual for him. No new or worsening symptoms related to hypertension.  He has a history of fatty liver, previously evaluated with an abdominal scan. He received a letter indicating a potential error in lab calculations related to albumin and protein in the urine. He is interested in supplements for liver health but is focusing on weight loss and a healthy diet.  He takes Lipitor and fenofibrate  for cholesterol management, and it has been a year since his last cholesterol check.  He experiences occasional gastroesophageal reflux, using over-the-counter Prilosec every other day. His reflux symptoms are not severe and may have improved with weight loss.    Health Maintenance Due  Topic Date Due   OPHTHALMOLOGY EXAM  07/12/2023   HEMOGLOBIN A1C  12/26/2023    Past Medical History:  Diagnosis Date   Chicken pox    Diabetes type 2, uncontrolled 01/27/2014   Fatty liver    Heartburn    History of chicken pox    Hypertension     Past Surgical History:  Procedure Laterality Date   KNEE SURGERY  age 89   bone  spur? left knee    Family History  Problem Relation Age of Onset   Diabetes Maternal Grandmother    Heart disease Maternal Grandmother        CABG x 4   Diabetes Paternal Grandfather    Kidney disease Paternal Grandfather        kidney failure due to diabetes?   Diabetes Mellitus II Mother        "borderline"   Sleep apnea Father    Diabetes Maternal Aunt    Heart disease Maternal Uncle    Cancer Paternal Uncle    Diabetes Paternal Uncle    Arrhythmia Maternal Grandfather     Social History   Socioeconomic History   Marital status: Married    Spouse name: Not on file   Number of children: 1   Years of education: Not on file   Highest education level: Not on file  Occupational History   Occupation: Heritage manager: Production assistant, radio FOR SELF EMPLOYED    Comment: [employer]  Tobacco Use   Smoking status: Never   Smokeless tobacco: Never  Substance and Sexual Activity   Alcohol use: Yes    Alcohol/week: 0.0 - 1.0 standard drinks of alcohol   Drug use: No   Sexual activity: Yes    Birth control/protection: None  Other Topics Concern   Not on file  Social History Narrative   **  Merged History Encounter **       Married to [employer]. 2 children [place] and [place]. Some college. 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 Drinks caffeinated beverages. Takes a daily vitamin. Wears his seatbelt, smoke detector in the home.  Regular exercise: no    Social Drivers of Corporate investment banker Strain: Not on file  Food Insecurity: Not on file  Transportation Needs: Not on file  Physical Activity: Not on file  Stress: Not on file  Social Connections: Not on file  Intimate Partner Violence: Not on file    Outpatient Medications Prior to Visit  Medication Sig Dispense Refill   aspirin  EC 81 MG tablet Take 1 tablet (81 mg total) by mouth daily.     atorvastatin  (LIPITOR) 10 MG tablet TAKE 1 TABLET BY MOUTH EVERY DAY 90 tablet 1   fenofibrate  (TRICOR ) 145 MG tablet TAKE 1 TABLET  BY MOUTH DAILY. NEEDS OV PRIOR TO ADDITIONAL REFILLS. 90 tablet 0   glucose blood (ACCU-CHEK ACTIVE STRIPS) test strip Use as instructed 100 each 12   Lancets (ACCU-CHEK MULTICLIX) lancets Use as instructed 100 each 12   lisinopril  (ZESTRIL ) 10 MG tablet TAKE 1 TABLET BY MOUTH EVERY DAY 90 tablet 1   metFORMIN  (GLUCOPHAGE ) 500 MG tablet TAKE 1 TABLET BY MOUTH 2 TIMES DAILY WITH A MEAL. NEEDS OV PRIOR TO ADDITIONAL REFILLS 180 tablet 0   Omega-3 Fatty Acids (FISH OIL) 1000 MG CAPS Take 2,000 mg by mouth 2 (two) times daily. Reported on 11/20/2015     omeprazole  (PRILOSEC) 20 MG capsule Take 1 capsule (20 mg total) by mouth daily as needed. 90 capsule 1   No facility-administered medications prior to visit.    No Known Allergies  ROS See HPI    Objective:     Physical Exam Constitutional:      General: He is not in acute distress.    Appearance: He is well-developed.  HENT:     Head: Normocephalic and atraumatic.  Cardiovascular:     Rate and Rhythm: Normal rate and regular rhythm.     Heart sounds: No murmur heard. Pulmonary:     Effort: Pulmonary effort is normal. No respiratory distress.     Breath sounds: Normal breath sounds. No wheezing or rales.  Skin:    General: Skin is warm and dry.  Neurological:     Mental Status: He is alert and oriented to person, place, and time.  Psychiatric:        Behavior: Behavior normal.        Thought Content: Thought content normal.      BP 125/60 (BP Location: Left Arm, Patient Position: Sitting, Cuff Size: Normal)   Pulse 60   Temp 97.8 F (36.6 C) (Oral)   Resp 16   Ht 5\' 9"  (1.753 m)   Wt 173 lb (78.5 kg)   SpO2 100%   BMI 25.55 kg/m  Wt Readings from Last 3 Encounters:  01/12/24 173 lb (78.5 kg)  06/27/23 183 lb (83 kg)  10/10/22 183 lb (83 kg)       Assessment & Plan:   Problem List Items Addressed This Visit       Unprioritized   Hyperlipidemia   Lab Results  Component Value Date   CHOL 146 10/10/2022    HDL 33.50 (L) 10/10/2022   LDLCALC 81 10/10/2022   LDLDIRECT 77.0 06/28/2019   TRIG 157.0 (H) 10/10/2022   CHOLHDL 4 10/10/2022  Update lipitor and fenofibrate .  Relevant Orders   Lipid panel   HTN (hypertension)   BP Readings from Last 3 Encounters:  01/12/24 125/60  06/27/23 125/66  10/10/22 122/71   At goal on lisinopril .        GERD (gastroesophageal reflux disease)   Stable with every other day prilosec OTC.        Fatty liver   Hopefully with his diet change and weight loss his LFT's will be improved.       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   Lab Results  Component Value Date   HGBA1C 6.4 06/27/2023   HGBA1C 6.9 (H) 10/10/2022   HGBA1C 6.2 01/31/2022   Lab Results  Component Value Date   MICROALBUR <0.7 06/27/2023   LDLCALC 81 10/10/2022   CREATININE 0.94 06/27/2023   Diabetes well-controlled, A1c 6.4. Adhering to diet, 10-pound weight loss. - Continue metformin  500 mg twice daily. - Update A1c today.      Relevant Orders   HgB A1c   Comp Met (CMET)   Urine Microalbumin w/creat. ratio    I am having Mario Ibarra maintain his Fish Oil, aspirin  EC, accu-chek multiclix, glucose blood, omeprazole , fenofibrate , metFORMIN , lisinopril , and atorvastatin .  No orders of the defined types were placed in this encounter.

## 2024-01-12 NOTE — Patient Instructions (Signed)
 VISIT SUMMARY:  Today, you had a follow-up visit to review your lab results and medications. Your diabetes and hypertension are well-controlled, and you have successfully lost 10 pounds. We discussed your liver health, cholesterol management, and occasional reflux symptoms.  YOUR PLAN:  TYPE 2 DIABETES MELLITUS: Your diabetes is well-controlled with an A1c of 6.4. You have been adhering to your diet and have lost 10 pounds. -Continue taking metformin  500 mg twice daily. -We will update your A1c today.  HYPERTENSION: Your blood pressure is well-controlled at 125/60 mmHg with lisinopril , which also provides renal protection. -Continue taking lisinopril .  FATTY LIVER DISEASE: Your fatty liver disease is being managed with weight loss and a healthy diet. Previous liver tests were slightly elevated, but we expect improvement with your recent weight loss. -We will repeat your liver function tests today.  GASTROESOPHAGEAL REFLUX DISEASE (GERD): You have mild reflux symptoms that are managed with Prilosec as needed. Your weight loss may have improved these symptoms. -Continue taking Prilosec as needed.  GENERAL HEALTH MAINTENANCE: Your preventive care is up to date, and you are a candidate for the pneumonia vaccine. -We can administer the pneumonia vaccine if you agree at a future visit.

## 2024-01-14 ENCOUNTER — Ambulatory Visit: Payer: Self-pay | Admitting: Family

## 2024-03-22 ENCOUNTER — Other Ambulatory Visit: Payer: Self-pay | Admitting: Family

## 2024-03-22 DIAGNOSIS — E785 Hyperlipidemia, unspecified: Secondary | ICD-10-CM

## 2024-03-22 DIAGNOSIS — E1129 Type 2 diabetes mellitus with other diabetic kidney complication: Secondary | ICD-10-CM

## 2024-06-19 ENCOUNTER — Other Ambulatory Visit: Payer: Self-pay | Admitting: Family

## 2024-06-19 DIAGNOSIS — E1129 Type 2 diabetes mellitus with other diabetic kidney complication: Secondary | ICD-10-CM

## 2024-06-19 DIAGNOSIS — E785 Hyperlipidemia, unspecified: Secondary | ICD-10-CM

## 2024-07-13 ENCOUNTER — Other Ambulatory Visit: Payer: Self-pay | Admitting: Family

## 2024-07-13 DIAGNOSIS — I10 Essential (primary) hypertension: Secondary | ICD-10-CM

## 2024-07-13 DIAGNOSIS — E785 Hyperlipidemia, unspecified: Secondary | ICD-10-CM

## 2024-08-02 ENCOUNTER — Telehealth: Payer: Self-pay | Admitting: Family

## 2024-08-02 ENCOUNTER — Other Ambulatory Visit: Payer: Self-pay

## 2024-08-02 DIAGNOSIS — I10 Essential (primary) hypertension: Secondary | ICD-10-CM

## 2024-08-02 DIAGNOSIS — E785 Hyperlipidemia, unspecified: Secondary | ICD-10-CM

## 2024-08-02 MED ORDER — FENOFIBRATE 145 MG PO TABS: 145.0000 mg | ORAL_TABLET | Freq: Every day | ORAL | 1 refills | Status: AC

## 2024-08-02 MED ORDER — ATORVASTATIN CALCIUM 10 MG PO TABS: 10.0000 mg | ORAL_TABLET | Freq: Every day | ORAL | 1 refills | Status: AC

## 2024-08-02 MED ORDER — LISINOPRIL 10 MG PO TABS: 10.0000 mg | ORAL_TABLET | Freq: Every day | ORAL | 1 refills | Status: AC

## 2024-08-02 NOTE — Telephone Encounter (Signed)
 Pts appt had to be rescheduled from 12/12 to 12/22. He states he does not think he will have enough medication to last him until that appointment. He requests a refill of his Lisinopril , atorvostatin, and fenofibrate  to last him until this appointment.

## 2024-08-09 ENCOUNTER — Ambulatory Visit: Admitting: Family

## 2024-08-19 ENCOUNTER — Ambulatory Visit: Payer: Self-pay | Admitting: Family

## 2024-08-19 ENCOUNTER — Telehealth: Payer: Self-pay | Admitting: Family

## 2024-08-19 ENCOUNTER — Encounter: Payer: Self-pay | Admitting: Family

## 2024-08-19 ENCOUNTER — Ambulatory Visit: Admitting: Family

## 2024-08-19 VITALS — BP 108/78 | HR 65 | Temp 98.2°F | Resp 16 | Ht 69.0 in | Wt 171.0 lb

## 2024-08-19 DIAGNOSIS — E785 Hyperlipidemia, unspecified: Secondary | ICD-10-CM

## 2024-08-19 DIAGNOSIS — Z0000 Encounter for general adult medical examination without abnormal findings: Secondary | ICD-10-CM

## 2024-08-19 DIAGNOSIS — R809 Proteinuria, unspecified: Secondary | ICD-10-CM | POA: Diagnosis not present

## 2024-08-19 DIAGNOSIS — E1129 Type 2 diabetes mellitus with other diabetic kidney complication: Secondary | ICD-10-CM | POA: Diagnosis not present

## 2024-08-19 DIAGNOSIS — Z7985 Long-term (current) use of injectable non-insulin antidiabetic drugs: Secondary | ICD-10-CM

## 2024-08-19 LAB — MICROALBUMIN / CREATININE URINE RATIO
Creatinine,U: 174.8 mg/dL
Microalb Creat Ratio: UNDETERMINED mg/g (ref 0.0–30.0)
Microalb, Ur: <0.7 mg/dL

## 2024-08-19 LAB — COMPREHENSIVE METABOLIC PANEL WITH GFR
ALT: 38 U/L (ref 3–53)
AST: 26 U/L (ref 5–37)
Albumin: 4.9 g/dL (ref 3.5–5.2)
Alkaline Phosphatase: 55 U/L (ref 39–117)
BUN: 12 mg/dL (ref 6–23)
CO2: 26 meq/L (ref 19–32)
Calcium: 9.5 mg/dL (ref 8.4–10.5)
Chloride: 101 meq/L (ref 96–112)
Creatinine, Ser: 1.13 mg/dL (ref 0.40–1.50)
GFR: 78.82 mL/min
Glucose, Bld: 149 mg/dL — ABNORMAL HIGH (ref 70–99)
Potassium: 4.4 meq/L (ref 3.5–5.1)
Sodium: 135 meq/L (ref 135–145)
Total Bilirubin: 0.4 mg/dL (ref 0.2–1.2)
Total Protein: 7.2 g/dL (ref 6.0–8.3)

## 2024-08-19 LAB — LIPID PANEL
Cholesterol: 140 mg/dL (ref 28–200)
HDL: 45.1 mg/dL
LDL Cholesterol: 81 mg/dL (ref 10–99)
NonHDL: 94.49
Total CHOL/HDL Ratio: 3
Triglycerides: 66 mg/dL (ref 10.0–149.0)
VLDL: 13.2 mg/dL (ref 0.0–40.0)

## 2024-08-19 LAB — HEMOGLOBIN A1C: Hgb A1c MFr Bld: 6.6 % — ABNORMAL HIGH (ref 4.6–6.5)

## 2024-08-19 NOTE — Assessment & Plan Note (Addendum)
 Continue work on [employer], exercise.  Weight is currently acceptable. Declines Hep B and HPV vaccines at this time, wishes to do some further research. Will also discuss colon cancer screening after his upcoming 44th birthday.  See mychart message to patient.

## 2024-08-19 NOTE — Progress Notes (Signed)
 "  Subjective:     Patient ID: Mario Ibarra, male    DOB: May 03, 1981, 43 y.o.   MRN: [id number]  Chief Complaint  Patient presents with   Hyperlipidemia   Hypertension   Diabetes   Follow-up    HPI  Discussed the use of AI scribe software for clinical note transcription with the patient, who gave verbal consent to proceed.  History of Present Illness Mario Ibarra is a 43 year old male who presents for a follow-up on his medications and dietary changes.  He has been consistently engaging in intermittent fasting and a carnivore diet, primarily consuming eggs and very few carbohydrates. Despite no significant weight loss, he notes changes in body composition as his clothing fits differently.  He reports a positive change in his nocturnal habits, specifically no longer waking up at night to urinate. His last A1c was 6.7.  He is currently on a low dose of Lipitor. Previous cholesterol readings showed a total cholesterol of 154 and LDL of 85. He has concerns about proteinuria, which has been noted in the past, but recent checks have been normal.  He is mindful of his dietary intake, particularly the order of consuming fiber and proteins, and avoids eating late at night, setting a cutoff at 6 PM. He has not eaten anything on the day of the visit.      Health Maintenance Due  Topic Date Due   Hepatitis B Vaccines 19-59 Average Risk (1 of 3 - 19+ 3-dose series) Never done   HPV VACCINES (1 - 3-dose SCDM series) Never done   HEMOGLOBIN A1C  07/14/2024    Past Medical History:  Diagnosis Date   Chicken pox    Diabetes type 2, uncontrolled 01/27/2014   Fatty liver    Heartburn    History of chicken pox    Hypertension     Past Surgical History:  Procedure Laterality Date   KNEE SURGERY  age 44   bone spur? left knee    Family History  Problem Relation Age of Onset   Diabetes Maternal Grandmother    Heart disease Maternal Grandmother        CABG x 4   Diabetes Paternal  Grandfather    Kidney disease Paternal Grandfather        kidney failure due to diabetes?   Diabetes Mellitus II Mother        borderline   Sleep apnea Father    Diabetes Maternal Aunt    Heart disease Maternal Uncle    Cancer Paternal Uncle    Diabetes Paternal Uncle    Arrhythmia Maternal Grandfather     Social History   Socioeconomic History   Marital status: Married    Spouse name: Not on file   Number of children: 1   Years of education: Not on file   Highest education level: Not on file  Occupational History   Occupation: Heritage Manager: PRODUCTION ASSISTANT, RADIO FOR SELF EMPLOYED    Comment: [employer]  Tobacco Use   Smoking status: Never   Smokeless tobacco: Never  Substance and Sexual Activity   Alcohol use: Yes    Alcohol/week: 0.0 - 1.0 standard drinks of alcohol   Drug use: No   Sexual activity: Yes    Birth control/protection: None  Other Topics Concern   Not on file  Social History Narrative   ** Merged History Encounter **       Married to [employer]. 2 children Kayla  and Kara. Some college. 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 Drinks caffeinated beverages. Takes a daily vitamin. Wears his seatbelt, smoke detector in the home.  Regular exercise: no    Social Drivers of Health   Tobacco Use: Low Risk (08/19/2024)   Patient History    Smoking Tobacco Use: Never    Smokeless Tobacco Use: Never    Passive Exposure: Not on file  Financial Resource Strain: Not on file  Food Insecurity: Not on file  Transportation Needs: Not on file  Physical Activity: Not on file  Stress: Not on file  Social Connections: Not on file  Intimate Partner Violence: Not on file  Depression (PHQ2-9): Low Risk (01/12/2024)   Depression (PHQ2-9)    PHQ-2 Score: 0  Alcohol Screen: Not on file  Housing: Not on file  Utilities: Not on file  Health Literacy: Not on file    Outpatient Medications Prior to Visit  Medication Sig Dispense Refill   aspirin  EC 81 MG tablet Take 1 tablet  (81 mg total) by mouth daily.     atorvastatin  (LIPITOR) 10 MG tablet Take 1 tablet (10 mg total) by mouth daily. 90 tablet 1   fenofibrate  (TRICOR ) 145 MG tablet Take 1 tablet (145 mg total) by mouth daily. 90 tablet 1   glucose blood (ACCU-CHEK ACTIVE STRIPS) test strip Use as instructed 100 each 12   Lancets (ACCU-CHEK MULTICLIX) lancets Use as instructed 100 each 12   lisinopril  (ZESTRIL ) 10 MG tablet Take 1 tablet (10 mg total) by mouth daily. 90 tablet 1   metFORMIN  (GLUCOPHAGE ) 500 MG tablet Take 1 tablet (500 mg total) by mouth 2 (two) times daily with a meal. 180 tablet 0   Omega-3 Fatty Acids (FISH OIL) 1000 MG CAPS Take 2,000 mg by mouth 2 (two) times daily. Reported on 11/20/2015     omeprazole  (PRILOSEC) 20 MG capsule Take 1 capsule (20 mg total) by mouth daily as needed. 90 capsule 1   No facility-administered medications prior to visit.    Allergies[1]  ROS See HPI    Objective:    Physical Exam Constitutional:      General: He is not in acute distress.    Appearance: He is well-developed.  HENT:     Head: Normocephalic and atraumatic.  Cardiovascular:     Rate and Rhythm: Normal rate and regular rhythm.     Heart sounds: No murmur heard. Pulmonary:     Effort: Pulmonary effort is normal. No respiratory distress.     Breath sounds: Normal breath sounds. No wheezing or rales.  Skin:    General: Skin is warm and dry.  Neurological:     Mental Status: He is alert and oriented to person, place, and time.  Psychiatric:        Behavior: Behavior normal.        Thought Content: Thought content normal.      BP 108/78 (BP Location: Left Arm, Patient Position: Sitting, Cuff Size: Normal)   Pulse 65   Temp 98.2 F (36.8 C) (Oral)   Resp 16   Ht 5' 9 (1.753 m)   Wt 171 lb (77.6 kg)   SpO2 99%   BMI 25.25 kg/m  Wt Readings from Last 3 Encounters:  08/19/24 171 lb (77.6 kg)  01/12/24 173 lb (78.5 kg)  06/27/23 183 lb (83 kg)       Lab Results  Component  Value Date   HGBA1C 6.7 (H) 01/12/2024   HGBA1C 6.4 06/27/2023  HGBA1C 6.9 (H) 10/10/2022   Lab Results  Component Value Date   MICROALBUR <0.7 01/12/2024   LDLCALC 85 01/12/2024   CREATININE 1.01 01/12/2024    Assessment & Plan:   Problem List Items Addressed This Visit       Unprioritized   Preventative health care - Primary   Continue work on healthy diet, exercise.  Weight is currently acceptable. Declines Hep B and HPV vaccines at this time, wishes to do some further research. Will also discuss colon cancer screening after his upcoming 44th birthday.  See mychart message to patient.        Hyperlipidemia   Lab Results  Component Value Date   CHOL 154 01/12/2024   HDL 43.70 01/12/2024   LDLCALC 85 01/12/2024   LDLDIRECT 77.0 06/28/2019   TRIG 124.0 01/12/2024   CHOLHDL 4 01/12/2024  Continues fenofibrate  and atorvastatin . Update lipid panel. Consider increasing statin if LDL >70.        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 Expect that recent dietary changes will have further reduced A1C.  Pt notes change in body composition with a decreased carb diet although his weight has stabilized.  - Continue current diabetes management regimen with Metformin . - Rechecked urine for proteinuria today. - Continue to control blood pressure and blood sugar to protect kidney function.      Other Visit Diagnoses       Controlled type 2 diabetes mellitus with microalbuminuria, without long-term current use of insulin (HCC)       Relevant Orders   HgB A1c   Urine Microalbumin w/creat. ratio   Comp Met (CMET)      Assessment & Plan     I am having Mario Ibarra maintain his Fish Oil, aspirin  EC, accu-chek multiclix, glucose blood, omeprazole , metFORMIN , lisinopril , fenofibrate , and atorvastatin .  No orders of the defined types were placed in this  encounter.      [1] No Known Allergies  "

## 2024-08-19 NOTE — Assessment & Plan Note (Signed)
 Last A1c at 6.7%. Expect that recent dietary changes will have further reduced A1C.  Pt notes change in body composition with a decreased carb diet although his weight has stabilized.  - Continue current diabetes management regimen with Metformin . - Rechecked urine for proteinuria today. - Continue to control blood pressure and blood sugar to protect kidney function.

## 2024-08-19 NOTE — Telephone Encounter (Signed)
 See mychart.

## 2024-08-19 NOTE — Patient Instructions (Signed)
" °  VISIT SUMMARY: Today, we discussed your current medications and dietary changes. You have been following an intermittent fasting and carnivore diet, which has led to changes in your body composition and improved nocturnal habits. We reviewed your recent lab results and addressed your concerns about proteinuria.  YOUR PLAN: -TYPE 2 DIABETES MELLITUS WITH DIABETIC KIDNEY COMPLICATION: Type 2 diabetes is a condition where your body does not use insulin properly, leading to high blood sugar levels. Your A1c is currently 6.7%, indicating improved blood sugar control. Continue your current diabetes management regimen, monitor your blood glucose levels regularly, and we rechecked your urine for proteinuria today. Keep controlling your blood pressure and blood sugar to protect your kidney function.  -HYPERLIPIDEMIA: Hyperlipidemia means you have high levels of fats (lipids) in your blood. Your LDL cholesterol is currently 85 mg/dL, which is above the target for diabetics. Continue taking atorvastatin  10 mg daily, and we may consider increasing the dose to 20 mg if your LDL remains above 70 mg/dL.  -GENERAL HEALTH MAINTENANCE: We performed a foot exam today, which is important for monitoring any complications from diabetes. We also discussed the hepatitis B and HPV vaccines, which are recommended for all adults and you wish to do some further research.                       "

## 2024-08-19 NOTE — Assessment & Plan Note (Addendum)
 Lab Results  Component Value Date   CHOL 154 01/12/2024   HDL 43.70 01/12/2024   LDLCALC 85 01/12/2024   LDLDIRECT 77.0 06/28/2019   TRIG 124.0 01/12/2024   CHOLHDL 4 01/12/2024  Continues fenofibrate  and atorvastatin . Update lipid panel. Consider increasing statin if LDL >70.

## 2024-09-19 ENCOUNTER — Other Ambulatory Visit: Payer: Self-pay | Admitting: Family

## 2024-09-19 DIAGNOSIS — E1129 Type 2 diabetes mellitus with other diabetic kidney complication: Secondary | ICD-10-CM

## 2024-09-19 NOTE — Telephone Encounter (Signed)
 Please call patient to schedule follow up with me in the end of April.

## 2024-09-19 NOTE — Telephone Encounter (Signed)
 Got him scheduled

## 2024-12-25 ENCOUNTER — Ambulatory Visit: Admitting: Family
